# Patient Record
Sex: Female | Born: 1935
Health system: Southern US, Community
[De-identification: ages and names within clinical notes are randomized; demographics above are authoritative.]

## PROBLEM LIST (undated history)

## (undated) DIAGNOSIS — K219 Gastro-esophageal reflux disease without esophagitis: Secondary | ICD-10-CM

## (undated) DIAGNOSIS — I10 Essential (primary) hypertension: Secondary | ICD-10-CM

## (undated) DIAGNOSIS — K859 Acute pancreatitis without necrosis or infection, unspecified: Secondary | ICD-10-CM

## (undated) DIAGNOSIS — R51 Headache: Secondary | ICD-10-CM

## (undated) DIAGNOSIS — C801 Malignant (primary) neoplasm, unspecified: Secondary | ICD-10-CM

## (undated) DIAGNOSIS — Z9889 Other specified postprocedural states: Secondary | ICD-10-CM

## (undated) DIAGNOSIS — E119 Type 2 diabetes mellitus without complications: Secondary | ICD-10-CM

## (undated) DIAGNOSIS — R011 Cardiac murmur, unspecified: Secondary | ICD-10-CM

## (undated) DIAGNOSIS — I219 Acute myocardial infarction, unspecified: Secondary | ICD-10-CM

## (undated) HISTORY — PX: BREAST SURGERY: SHX581

## (undated) HISTORY — PX: CORONARY ANGIOPLASTY: SHX604

## (undated) HISTORY — PX: TONGUE SURGERY: SHX810

## (undated) HISTORY — PX: HERNIA REPAIR: SHX51

## (undated) HISTORY — PX: SHOULDER SURGERY: SHX246

## (undated) HISTORY — DX: Acute myocardial infarction, unspecified: I21.9

## (undated) HISTORY — DX: Other specified postprocedural states: Z98.890

## (undated) HISTORY — PX: TONSILLECTOMY: SUR1361

---

## 1998-02-21 ENCOUNTER — Encounter: Admission: RE | Admit: 1998-02-21 | Discharge: 1998-05-22 | Payer: Self-pay | Admitting: Radiation Oncology

## 1998-11-13 ENCOUNTER — Other Ambulatory Visit: Admission: RE | Admit: 1998-11-13 | Discharge: 1998-11-13 | Payer: Self-pay | Admitting: Obstetrics and Gynecology

## 1999-08-01 ENCOUNTER — Encounter: Admission: RE | Admit: 1999-08-01 | Discharge: 1999-10-30 | Payer: Self-pay | Admitting: Family Medicine

## 1999-09-22 ENCOUNTER — Other Ambulatory Visit: Admission: RE | Admit: 1999-09-22 | Discharge: 1999-09-22 | Payer: Self-pay | Admitting: Obstetrics and Gynecology

## 2001-11-01 ENCOUNTER — Other Ambulatory Visit: Admission: RE | Admit: 2001-11-01 | Discharge: 2001-11-01 | Payer: Self-pay | Admitting: Obstetrics and Gynecology

## 2002-11-07 ENCOUNTER — Other Ambulatory Visit: Admission: RE | Admit: 2002-11-07 | Discharge: 2002-11-07 | Payer: Self-pay | Admitting: Obstetrics and Gynecology

## 2003-11-12 ENCOUNTER — Other Ambulatory Visit: Admission: RE | Admit: 2003-11-12 | Discharge: 2003-11-12 | Payer: Self-pay | Admitting: Obstetrics and Gynecology

## 2004-10-09 ENCOUNTER — Ambulatory Visit: Payer: Self-pay | Admitting: Family Medicine

## 2004-10-23 ENCOUNTER — Encounter: Admission: RE | Admit: 2004-10-23 | Discharge: 2004-10-23 | Payer: Self-pay | Admitting: Unknown Physician Specialty

## 2004-11-12 ENCOUNTER — Ambulatory Visit: Payer: Self-pay | Admitting: Family Medicine

## 2004-11-13 ENCOUNTER — Other Ambulatory Visit: Admission: RE | Admit: 2004-11-13 | Discharge: 2004-11-13 | Payer: Self-pay | Admitting: Obstetrics and Gynecology

## 2004-11-20 ENCOUNTER — Ambulatory Visit: Payer: Self-pay | Admitting: Family Medicine

## 2005-06-30 ENCOUNTER — Ambulatory Visit: Payer: Self-pay | Admitting: Family Medicine

## 2005-07-01 ENCOUNTER — Ambulatory Visit: Payer: Self-pay | Admitting: Family Medicine

## 2005-07-10 ENCOUNTER — Ambulatory Visit: Payer: Self-pay | Admitting: Family Medicine

## 2005-09-07 ENCOUNTER — Ambulatory Visit: Payer: Self-pay | Admitting: Oncology

## 2005-10-07 ENCOUNTER — Ambulatory Visit: Payer: Self-pay | Admitting: Family Medicine

## 2005-11-12 ENCOUNTER — Other Ambulatory Visit: Admission: RE | Admit: 2005-11-12 | Discharge: 2005-11-12 | Payer: Self-pay | Admitting: Obstetrics and Gynecology

## 2006-01-05 ENCOUNTER — Ambulatory Visit: Payer: Self-pay | Admitting: Family Medicine

## 2006-09-15 ENCOUNTER — Ambulatory Visit: Payer: Self-pay | Admitting: Oncology

## 2014-05-31 ENCOUNTER — Ambulatory Visit (INDEPENDENT_AMBULATORY_CARE_PROVIDER_SITE_OTHER): Payer: Medicare HMO | Admitting: Surgery

## 2014-05-31 ENCOUNTER — Encounter (INDEPENDENT_AMBULATORY_CARE_PROVIDER_SITE_OTHER): Payer: Self-pay | Admitting: Surgery

## 2014-05-31 VITALS — BP 126/78 | HR 72 | Temp 98.0°F | Resp 18 | Ht 60.0 in | Wt 175.0 lb

## 2014-05-31 DIAGNOSIS — K829 Disease of gallbladder, unspecified: Secondary | ICD-10-CM

## 2014-05-31 HISTORY — DX: Disease of gallbladder, unspecified: K82.9

## 2014-05-31 NOTE — Progress Notes (Signed)
Re:   Erika Hamilton DOB:   1975/08/29 MRN:   [id number]  ASSESSMENT AND PLAN: 1.  Gall bladder disease  I discussed with the patient the indications and risks of gall bladder surgery.  The primary risks of gall bladder surgery include, but are not limited to, bleeding, infection, common bile duct injury, and open surgery.  There is also the risk that the patient may have continued symptoms after surgery.  However, the likelihood of improvement in symptoms and return to the patient's normal status is good. We discussed the typical post-operative recovery course. I tried to answer the patient's questions.  I gave the patient literature about gall bladder surgery.  She has heard that people just watch gall bladder disease and that is certainly an option.  But with a clear history of pancreatitis (though she did not require hospitalization), I think she would be best served with surgery.  2.  Stage 3 left breast cancer - dxed in 1998  Sees Erika Hamilton  3.  SCCa of the tongue  Excised 2012 4.  History of pancreatitis  Probably secondary to gall stones. 5.  DM x 15 years 6.  Small hiatal hernia  Chief Complaint  Patient presents with  . New Evaluation    gall bladder   REFERRING PHYSICIAN: PROCHNAU,CAROLINE, MD  HISTORY OF PRESENT ILLNESS: Erika Hamilton is a 80 y.o. (DOB: 1933-10-03)  white  female whose primary care physician is PROCHNAU,CAROLINE, MD and comes to me today for gall bladder disease. She is accompanied by her husband, whom she has been married to 12 years. I took care of a friend of theirs mother Erika Hamilton) and I was recommended by her.  The patient was doing well until May when she developed abdominal pain. She first went to an urgent care center and then to [facility] emergency room. She was diagnosed with pancreatitis, but not hospitalized. She was placed on a bland diet. She has done well since her attack. She's had no other history of stomach, liver, or  colon problems. Her last colonoscopy was approximately 2005.  03/30/2014 - CT scan of abdomen - early acture pancreatitis, cholelithiasis, atherosclerotic vascular disease, small HH 04/20/2104 - [place] of abdomen - tiny layering gall stones, normal CBD  She is going to [place] with her family in early August - so she has requested surgery between Aug 10 - 24 and they want to go to [facility].   No past medical history on file.   No past surgical history on file.    Current Outpatient Prescriptions  Medication Sig Dispense Refill  . cholestyramine (QUESTRAN) 4 G packet       . Fish Oil OIL by Does not apply route.      Marland Kitchen glipiZIDE (GLUCOTROL XL) 10 MG 24 hr tablet       . ketoconazole (NIZORAL) 2 % shampoo       . ONGLYZA 5 MG TABS tablet       . pantoprazole (PROTONIX) 40 MG tablet       . pravastatin (PRAVACHOL) 40 MG tablet       . quinapril (ACCUPRIL) 40 MG tablet       . WELCHOL 625 MG tablet        No current facility-administered medications for this visit.     No Known Allergies  REVIEW OF SYSTEMS: Skin:  No history of rash.  No history of abnormal moles. Infection:  No history of hepatitis or HIV.  No history of MRSA. Neurologic:  No history of stroke.  No history of seizure.  No history of headaches. Cardiac:  HTN > 10 years. Pulmonary:  Does not Hamilton cigarettes.  No asthma or bronchitis.  No OSA/CPAP. Breasts:  Stage 3 breast cancer.  Sees Erika Hamilton.  Endocrine:  DM x 15 years.   No thyroid disease. Gastrointestinal:  See HPI.  History of tongue cancer - 2012 - Erika Hamilton in [place].  She said that she had an abdominal hernia repaired int he the 1980's - but she is unsure of exactly where the hernia was or whether mesh was used. Urologic:  No history of kidney stones.  No history of bladder infections. GYN:  She has a history of a fibroid being removed, but not her uterus. Musculoskeletal:  No history of joint or back disease. Hematologic:  No bleeding disorder.   No history of anemia.  Not anticoagulated. Psycho-social:  The patient is oriented.   The patient has no obvious psychologic or social impairment to understanding our conversation and plan.  SOCIAL and FAMILY HISTORY: She is accompanied by her husband, whom she has been marrie dto 5 years. She has 3 children: 50,44, and 38.  PHYSICAL EXAM: BP 126/78  Pulse 72  Temp(Src) 98 F (36.7 C)  Resp 18  Ht 5' (1.524 m)  Wt 175 lb (79.379 kg)  BMI 34.18 kg/m2  General: Obese older WF who is alert and generally healthy appearing.  HEENT: Normal. Pupils equal. Neck: Supple. No mass.  No thyroid mass. Lymph Nodes:  No supraclavicular or cervical nodes. Lungs: Clear to auscultation and symmetric breath sounds. Heart:  RRR. She has a 2/6 systolic murmur. Abdomen: Soft. No mass. No tenderness. No hernia. Normal bowel sounds.  Obese.  Lower midline scar. Rectal: Not done. Extremities:  Good strength and ROM  in upper and lower extremities. Neurologic:  Grossly intact to motor and sensory function. Psychiatric: Has normal mood and affect. Behavior is normal.   DATA REVIEWED: Notes from [place], MD,  [facility], [address]  [place], [place]    [place] Phone:  [place]:  [phone]

## 2014-07-04 ENCOUNTER — Encounter (HOSPITAL_COMMUNITY): Payer: Self-pay | Admitting: Pharmacy Technician

## 2014-07-05 ENCOUNTER — Encounter (HOSPITAL_COMMUNITY)
Admission: RE | Admit: 2014-07-05 | Discharge: 2014-07-05 | Disposition: A | Discharge status: Home / Self Care | Payer: Medicare HMO | Source: Ambulatory Visit | Attending: Surgery | Admitting: Surgery

## 2014-07-05 ENCOUNTER — Encounter (HOSPITAL_COMMUNITY): Payer: Self-pay

## 2014-07-05 DIAGNOSIS — K219 Gastro-esophageal reflux disease without esophagitis: Secondary | ICD-10-CM | POA: Diagnosis not present

## 2014-07-05 DIAGNOSIS — Z01818 Encounter for other preprocedural examination: Secondary | ICD-10-CM | POA: Insufficient documentation

## 2014-07-05 DIAGNOSIS — E119 Type 2 diabetes mellitus without complications: Secondary | ICD-10-CM | POA: Diagnosis not present

## 2014-07-05 DIAGNOSIS — I10 Essential (primary) hypertension: Secondary | ICD-10-CM | POA: Diagnosis not present

## 2014-07-05 DIAGNOSIS — E669 Obesity, unspecified: Secondary | ICD-10-CM | POA: Diagnosis not present

## 2014-07-05 DIAGNOSIS — Z01812 Encounter for preprocedural laboratory examination: Secondary | ICD-10-CM | POA: Insufficient documentation

## 2014-07-05 HISTORY — DX: Acute pancreatitis without necrosis or infection, unspecified: K85.90

## 2014-07-05 HISTORY — DX: Gastro-esophageal reflux disease without esophagitis: K21.9

## 2014-07-05 HISTORY — DX: Cardiac murmur, unspecified: R01.1

## 2014-07-05 HISTORY — DX: Malignant (primary) neoplasm, unspecified: C80.1

## 2014-07-05 HISTORY — DX: Type 2 diabetes mellitus without complications: E11.9

## 2014-07-05 HISTORY — DX: Headache: R51

## 2014-07-05 HISTORY — DX: Essential (primary) hypertension: I10

## 2014-07-05 LAB — COMPREHENSIVE METABOLIC PANEL
ALT: 22 U/L (ref 0–35)
ANION GAP: 17 — AB (ref 5–15)
AST: 22 U/L (ref 0–37)
Albumin: 3.7 g/dL (ref 3.5–5.2)
Alkaline Phosphatase: 77 U/L (ref 39–117)
BUN: 13 mg/dL (ref 6–23)
CALCIUM: 9.1 mg/dL (ref 8.4–10.5)
CO2: 22 mEq/L (ref 19–32)
Chloride: 100 mEq/L (ref 96–112)
Creatinine, Ser: 0.59 mg/dL (ref 0.50–1.10)
GFR calc non Af Amer: 86 mL/min — ABNORMAL LOW (ref >90)
GLUCOSE: 173 mg/dL — AB (ref 70–99)
Potassium: 4.3 mEq/L (ref 3.7–5.3)
SODIUM: 139 meq/L (ref 137–147)
Total Bilirubin: 0.4 mg/dL (ref 0.3–1.2)
Total Protein: 7.3 g/dL (ref 6.0–8.3)

## 2014-07-05 LAB — LIPASE, BLOOD: Lipase: 35 U/L (ref 11–59)

## 2014-07-05 LAB — CBC WITH DIFFERENTIAL/PLATELET
BASOS PCT: 1 % (ref 0–1)
Basophils Absolute: 0.1 10*3/uL (ref 0.0–0.1)
Eosinophils Absolute: 0.2 10*3/uL (ref 0.0–0.7)
Eosinophils Relative: 2 % (ref 0–5)
HCT: 43.7 % (ref 36.0–46.0)
HEMOGLOBIN: 14 g/dL (ref 12.0–15.0)
Lymphocytes Relative: 41 % (ref 12–46)
Lymphs Abs: 3.9 10*3/uL (ref 0.7–4.0)
MCH: 29.4 pg (ref 26.0–34.0)
MCHC: 32 g/dL (ref 30.0–36.0)
MCV: 91.6 fL (ref 78.0–100.0)
Monocytes Absolute: 0.6 10*3/uL (ref 0.1–1.0)
Monocytes Relative: 6 % (ref 3–12)
NEUTROS PCT: 50 % (ref 43–77)
Neutro Abs: 5 10*3/uL (ref 1.7–7.7)
Platelets: 275 10*3/uL (ref 150–400)
RBC: 4.77 MIL/uL (ref 3.87–5.11)
RDW: 14.1 % (ref 11.5–15.5)
WBC: 9.7 10*3/uL (ref 4.0–10.5)

## 2014-07-05 NOTE — Progress Notes (Signed)
Primary - dr. Chrys Racer procheau - [place] No cardiologist Had ekg and chest xray at [facility] in may 2015- will request records

## 2014-07-05 NOTE — Pre-Procedure Instructions (Signed)
Erika Hamilton  07/05/2014   Your procedure is scheduled on:  Monday, August 17th  Report to [facility] at 0530 AM.  Call this number if you have problems the morning of surgery: [phone]   Remember:   Do not eat food or drink liquids after midnight.   Take these medicines the morning of surgery with A SIP OF WATER: protonix, zyrtec  Stop taking aspirin, OTC vitamins/herbal medications, NSAIDS (ibuprofen, advil, motrin) 7 days prior to surgery.   Do not wear jewelry, make-up or nail polish.  Do not wear lotions, powders, or perfumes. You may wear deodorant.  Do not shave 48 hours prior to surgery. Men may shave face and neck.  Do not bring valuables to the hospital.  [facility] is not responsible  for any belongings or valuables.               Contacts, dentures or bridgework may not be worn into surgery.  Leave suitcase in the car. After surgery it may be brought to your room.  For patients admitted to the hospital, discharge time is determined by your  treatment team.               Patients discharged the day of surgery will not be allowed to drive home.  Please read over the following fact sheets that you were given: Pain Booklet, Coughing and Deep Breathing and Surgical Site Infection Prevention [place] - Preparing for Surgery  Before surgery, you can play an important role.  Because skin is not sterile, your skin needs to be as free of germs as possible.  You can reduce the number of germs on you skin by washing with CHG (chlorahexidine gluconate) soap before surgery.  CHG is an antiseptic cleaner which kills germs and bonds with the skin to continue killing germs even after washing.  Please DO NOT use if you have an allergy to CHG or antibacterial soaps.  If your skin becomes reddened/irritated stop using the CHG and inform your nurse when you arrive at Short Stay.  Do not shave (including legs and underarms) for at least 48 hours prior to the first  CHG shower.  You may shave your face.  Please follow these instructions carefully:   1.  Shower with CHG Soap the night before surgery and the morning of Surgery.  2.  If you choose to wash your hair, wash your hair first as usual with your normal shampoo.  3.  After you shampoo, rinse your hair and body thoroughly to remove the shampoo.  4.  Use CHG as you would any other liquid soap.  You can apply CHG directly to the skin and wash gently with scrungie or a clean washcloth.  5.  Apply the CHG Soap to your body ONLY FROM THE NECK DOWN.  Do not use on open wounds or open sores.  Avoid contact with your eyes, ears, mouth and genitals (private parts).  Wash genitals (private parts) with your normal soap.  6.  Wash thoroughly, paying special attention to the area where your surgery will be performed.  7.  Thoroughly rinse your body with warm water from the neck down.  8.  DO NOT shower/wash with your normal soap after using and rinsing off the CHG Soap.  9.  Pat yourself dry with a clean towel.            10.  Wear clean pajamas.  11.  Place clean sheets on your bed the night of your first shower and do not sleep with pets.  Day of Surgery  Do not apply any lotions/deoderants the morning of surgery.  Please wear clean clothes to the hospital/surgery center.

## 2014-07-06 NOTE — Progress Notes (Signed)
Anesthesia Chart Review:  Patient is a 80 year old female scheduled for cholecystectomy on 07/09/14 by Dr. Alphonsa Overall.  History includes non-smoker, HTN, murmur (not specified), DM2, GERD, acute gallstone pancreatitis 05/2014, headaches, tongue cancer s/p surgical excision '12, stage III breast cancer s/p left lumpectomy '98, skin cancer, UHR. PCP is Dr. Laqueta Due who referred patient to [facility]. BMI is consistent with obesity.  EKG from 03/30/14 [facility]) showed: NSR, possible LAE, incomplete right BBB, LVH. Borderline LAD. [facility] has no additional EKGs.  There are none in [place] and none at Dr. Felipa Emory office. She had similar appearing EKGs at [facility] earlier that day but with lower voltage in lead II. No CV symptoms documented at her PAT visit or noted from her [place] office note last month.  CXR on 03/30/14 from [facility] showed: Heart appears normal in size. There is no evidence of infiltrate, edema, mass or effusion. Bony structures are unremarkable. Surgical clips in left axilla.  No active disease.  Preoperative labs noted.   She will be evaluated by her assigned anesthesiologist on the day of surgery.  If no acute changes or new CV symptoms then I would anticipate that she could proceed as planned.  George Hugh [facility] Short Stay Center/Anesthesiology Phone [phone] 07/06/2014 2:11 PM

## 2014-07-08 MED ORDER — CEFAZOLIN SODIUM-DEXTROSE 2-3 GM-% IV SOLR
2.0000 g | INTRAVENOUS | Status: AC
  Administered 2014-07-09: 2 g via INTRAVENOUS
  Filled 2014-07-08: qty 50

## 2014-07-08 MED ORDER — CHLORHEXIDINE GLUCONATE 4 % EX LIQD
1.0000 "application " | Freq: Once | CUTANEOUS | Status: DC
  Filled 2014-07-08: qty 15

## 2014-07-09 ENCOUNTER — Observation Stay (HOSPITAL_COMMUNITY)
Admission: RE | Admit: 2014-07-09 | Discharge: 2014-07-10 | Disposition: A | Discharge status: Home / Self Care | Payer: Medicare HMO | Source: Ambulatory Visit | Attending: Surgery | Admitting: Surgery

## 2014-07-09 ENCOUNTER — Ambulatory Visit (HOSPITAL_COMMUNITY): Payer: Medicare HMO

## 2014-07-09 ENCOUNTER — Encounter (HOSPITAL_COMMUNITY)
Admission: RE | Disposition: A | Discharge status: Home / Self Care | Payer: Self-pay | Source: Ambulatory Visit | Attending: Surgery

## 2014-07-09 ENCOUNTER — Encounter (HOSPITAL_COMMUNITY): Payer: Medicare HMO | Admitting: Vascular Surgery

## 2014-07-09 ENCOUNTER — Encounter (HOSPITAL_COMMUNITY): Payer: Self-pay | Admitting: *Deleted

## 2014-07-09 ENCOUNTER — Ambulatory Visit (HOSPITAL_COMMUNITY): Payer: Medicare HMO | Admitting: Anesthesiology

## 2014-07-09 DIAGNOSIS — K449 Diaphragmatic hernia without obstruction or gangrene: Secondary | ICD-10-CM | POA: Diagnosis not present

## 2014-07-09 DIAGNOSIS — Z23 Encounter for immunization: Secondary | ICD-10-CM | POA: Diagnosis not present

## 2014-07-09 DIAGNOSIS — Z79899 Other long term (current) drug therapy: Secondary | ICD-10-CM | POA: Insufficient documentation

## 2014-07-09 DIAGNOSIS — Z853 Personal history of malignant neoplasm of breast: Secondary | ICD-10-CM | POA: Insufficient documentation

## 2014-07-09 DIAGNOSIS — R011 Cardiac murmur, unspecified: Secondary | ICD-10-CM | POA: Insufficient documentation

## 2014-07-09 DIAGNOSIS — Z85810 Personal history of malignant neoplasm of tongue: Secondary | ICD-10-CM | POA: Diagnosis not present

## 2014-07-09 DIAGNOSIS — K660 Peritoneal adhesions (postprocedural) (postinfection): Secondary | ICD-10-CM | POA: Insufficient documentation

## 2014-07-09 DIAGNOSIS — K8010 Calculus of gallbladder with chronic cholecystitis without obstruction: Principal | ICD-10-CM | POA: Insufficient documentation

## 2014-07-09 DIAGNOSIS — E119 Type 2 diabetes mellitus without complications: Secondary | ICD-10-CM | POA: Insufficient documentation

## 2014-07-09 DIAGNOSIS — K829 Disease of gallbladder, unspecified: Secondary | ICD-10-CM

## 2014-07-09 DIAGNOSIS — K828 Other specified diseases of gallbladder: Secondary | ICD-10-CM | POA: Diagnosis present

## 2014-07-09 HISTORY — PX: CHOLECYSTECTOMY: SHX55

## 2014-07-09 LAB — GLUCOSE, CAPILLARY
GLUCOSE-CAPILLARY: 276 mg/dL — AB (ref 70–99)
Glucose-Capillary: 126 mg/dL — ABNORMAL HIGH (ref 70–99)
Glucose-Capillary: 214 mg/dL — ABNORMAL HIGH (ref 70–99)
Glucose-Capillary: 197 mg/dL — ABNORMAL HIGH (ref 70–99)
Glucose-Capillary: 207 mg/dL — ABNORMAL HIGH (ref 70–99)

## 2014-07-09 SURGERY — LAPAROSCOPIC CHOLECYSTECTOMY WITH INTRAOPERATIVE CHOLANGIOGRAM
Anesthesia: General | Site: Abdomen

## 2014-07-09 SURGERY — Surgical Case
Anesthesia: *Unknown

## 2014-07-09 MED ORDER — DEXAMETHASONE SODIUM PHOSPHATE 4 MG/ML IJ SOLN
INTRAMUSCULAR | Status: DC | PRN
  Administered 2014-07-09: 8 mg via INTRAVENOUS

## 2014-07-09 MED ORDER — ARTIFICIAL TEARS OP OINT
TOPICAL_OINTMENT | OPHTHALMIC | Status: AC
Start: 2014-07-09 — End: 2014-07-09
  Filled 2014-07-09: qty 3.5

## 2014-07-09 MED ORDER — PROPOFOL 10 MG/ML IV BOLUS
INTRAVENOUS | Status: DC | PRN
  Administered 2014-07-09: 20 mg via INTRAVENOUS
  Administered 2014-07-09: 120 mg via INTRAVENOUS

## 2014-07-09 MED ORDER — FENTANYL CITRATE 0.05 MG/ML IJ SOLN
INTRAMUSCULAR | Status: DC | PRN
  Administered 2014-07-09 (×5): 50 ug via INTRAVENOUS

## 2014-07-09 MED ORDER — GLYCOPYRROLATE 0.2 MG/ML IJ SOLN
INTRAMUSCULAR | Status: DC | PRN
  Administered 2014-07-09: 0.6 mg via INTRAVENOUS

## 2014-07-09 MED ORDER — ZOLPIDEM TARTRATE 5 MG PO TABS: 5.0000 mg | ORAL_TABLET | Freq: Every evening | ORAL | Status: DC | PRN

## 2014-07-09 MED ORDER — KCL IN DEXTROSE-NACL 20-5-0.45 MEQ/L-%-% IV SOLN
INTRAVENOUS | Status: AC
  Filled 2014-07-09: qty 1000

## 2014-07-09 MED ORDER — PROPOFOL 10 MG/ML IV BOLUS
INTRAVENOUS | Status: AC
  Filled 2014-07-09: qty 20

## 2014-07-09 MED ORDER — LIDOCAINE HCL (CARDIAC) 20 MG/ML IV SOLN
INTRAVENOUS | Status: DC | PRN
  Administered 2014-07-09: 60 mg via INTRAVENOUS

## 2014-07-09 MED ORDER — FENTANYL CITRATE 0.05 MG/ML IJ SOLN
INTRAMUSCULAR | Status: AC
  Filled 2014-07-09: qty 5

## 2014-07-09 MED ORDER — IOHEXOL 300 MG/ML  SOLN
INTRAMUSCULAR | Status: DC | PRN
  Administered 2014-07-09: 08:00:00

## 2014-07-09 MED ORDER — HYDROMORPHONE HCL PF 1 MG/ML IJ SOLN
0.2500 mg | INTRAMUSCULAR | Status: DC | PRN
Start: 2014-07-09 — End: 2014-07-09

## 2014-07-09 MED ORDER — ROCURONIUM BROMIDE 100 MG/10ML IV SOLN
INTRAVENOUS | Status: DC | PRN
  Administered 2014-07-09: 50 mg via INTRAVENOUS

## 2014-07-09 MED ORDER — ONDANSETRON HCL 4 MG/2ML IJ SOLN
INTRAMUSCULAR | Status: AC
  Filled 2014-07-09: qty 2

## 2014-07-09 MED ORDER — BUPIVACAINE HCL 0.25 % IJ SOLN
INTRAMUSCULAR | Status: DC | PRN
  Administered 2014-07-09: 30 mL

## 2014-07-09 MED ORDER — EPHEDRINE SULFATE 50 MG/ML IJ SOLN
INTRAMUSCULAR | Status: AC
  Filled 2014-07-09: qty 1

## 2014-07-09 MED ORDER — LACTATED RINGERS IV SOLN
INTRAVENOUS | Status: DC | PRN
  Administered 2014-07-09: 07:00:00 via INTRAVENOUS

## 2014-07-09 MED ORDER — ONDANSETRON HCL 4 MG PO TABS
4.0000 mg | ORAL_TABLET | Freq: Four times a day (QID) | ORAL | Status: DC | PRN
Start: 2014-07-09 — End: 2014-07-10

## 2014-07-09 MED ORDER — QUINAPRIL HCL 10 MG PO TABS
40.0000 mg | ORAL_TABLET | Freq: Every day | ORAL | Status: DC
Start: 2014-07-10 — End: 2014-07-10
  Administered 2014-07-10: 40 mg via ORAL
  Filled 2014-07-09: qty 4

## 2014-07-09 MED ORDER — OXYCODONE HCL 5 MG PO TABS: 5.0000 mg | ORAL_TABLET | Freq: Once | ORAL | Status: DC | PRN

## 2014-07-09 MED ORDER — PANTOPRAZOLE SODIUM 40 MG PO TBEC
40.0000 mg | DELAYED_RELEASE_TABLET | Freq: Every day | ORAL | Status: DC
  Administered 2014-07-10: 40 mg via ORAL
  Filled 2014-07-09: qty 1

## 2014-07-09 MED ORDER — SODIUM CHLORIDE 0.9 % IR SOLN
Status: DC | PRN
  Administered 2014-07-09: 1000 mL

## 2014-07-09 MED ORDER — GLYCOPYRROLATE 0.2 MG/ML IJ SOLN
INTRAMUSCULAR | Status: AC
  Filled 2014-07-09: qty 3

## 2014-07-09 MED ORDER — INSULIN ASPART 100 UNIT/ML ~~LOC~~ SOLN
0.0000 [IU] | Freq: Three times a day (TID) | SUBCUTANEOUS | Status: DC
  Administered 2014-07-09: 5 [IU] via SUBCUTANEOUS
  Administered 2014-07-09 – 2014-07-10 (×2): 3 [IU] via SUBCUTANEOUS

## 2014-07-09 MED ORDER — POTASSIUM CHLORIDE IN NACL 20-0.45 MEQ/L-% IV SOLN
INTRAVENOUS | Status: DC
  Administered 2014-07-09: 12:00:00 via INTRAVENOUS
  Filled 2014-07-09 (×3): qty 1000

## 2014-07-09 MED ORDER — ONDANSETRON HCL 4 MG/2ML IJ SOLN: 4.0000 mg | Freq: Four times a day (QID) | INTRAMUSCULAR | Status: DC | PRN

## 2014-07-09 MED ORDER — MIDAZOLAM HCL 2 MG/2ML IJ SOLN
INTRAMUSCULAR | Status: AC
  Filled 2014-07-09: qty 2

## 2014-07-09 MED ORDER — DIPHENHYDRAMINE HCL 25 MG PO CAPS: 25.0000 mg | ORAL_CAPSULE | Freq: Every evening | ORAL | Status: DC | PRN

## 2014-07-09 MED ORDER — NEOSTIGMINE METHYLSULFATE 10 MG/10ML IV SOLN
INTRAVENOUS | Status: AC
  Filled 2014-07-09: qty 1

## 2014-07-09 MED ORDER — BUPIVACAINE HCL (PF) 0.25 % IJ SOLN
INTRAMUSCULAR | Status: AC
  Filled 2014-07-09: qty 30

## 2014-07-09 MED ORDER — 0.9 % SODIUM CHLORIDE (POUR BTL) OPTIME
TOPICAL | Status: DC | PRN
  Administered 2014-07-09: 1000 mL

## 2014-07-09 MED ORDER — HEPARIN SODIUM (PORCINE) 5000 UNIT/ML IJ SOLN
5000.0000 [IU] | Freq: Three times a day (TID) | INTRAMUSCULAR | Status: DC
  Administered 2014-07-09 – 2014-07-10 (×3): 5000 [IU] via SUBCUTANEOUS
  Filled 2014-07-09 (×6): qty 1

## 2014-07-09 MED ORDER — MORPHINE SULFATE 2 MG/ML IJ SOLN
1.0000 mg | INTRAMUSCULAR | Status: DC | PRN
  Administered 2014-07-09 (×2): 2 mg via INTRAVENOUS
  Filled 2014-07-09 (×2): qty 1

## 2014-07-09 MED ORDER — ONDANSETRON HCL 4 MG/2ML IJ SOLN
INTRAMUSCULAR | Status: DC | PRN
  Administered 2014-07-09: 4 mg via INTRAVENOUS

## 2014-07-09 MED ORDER — NEOSTIGMINE METHYLSULFATE 10 MG/10ML IV SOLN
INTRAVENOUS | Status: DC | PRN
  Administered 2014-07-09: 4 mg via INTRAVENOUS

## 2014-07-09 MED ORDER — HYDROCODONE-ACETAMINOPHEN 5-325 MG PO TABS
1.0000 | ORAL_TABLET | ORAL | Status: DC | PRN
  Administered 2014-07-09 – 2014-07-10 (×2): 2 via ORAL
  Filled 2014-07-09 (×2): qty 2

## 2014-07-09 MED ORDER — SUCCINYLCHOLINE CHLORIDE 20 MG/ML IJ SOLN
INTRAMUSCULAR | Status: AC
  Filled 2014-07-09: qty 1

## 2014-07-09 MED ORDER — ROCURONIUM BROMIDE 50 MG/5ML IV SOLN
INTRAVENOUS | Status: AC
Start: 2014-07-09 — End: 2014-07-09
  Filled 2014-07-09: qty 1

## 2014-07-09 MED ORDER — GLIPIZIDE ER 10 MG PO TB24
10.0000 mg | ORAL_TABLET | Freq: Two times a day (BID) | ORAL | Status: DC
  Administered 2014-07-09 – 2014-07-10 (×2): 10 mg via ORAL
  Filled 2014-07-09 (×4): qty 1

## 2014-07-09 MED ORDER — OXYCODONE HCL 5 MG/5ML PO SOLN: 5.0000 mg | Freq: Once | ORAL | Status: DC | PRN

## 2014-07-09 MED ORDER — SODIUM CHLORIDE 0.9 % IJ SOLN
INTRAMUSCULAR | Status: AC
  Filled 2014-07-09: qty 10

## 2014-07-09 MED ORDER — IBUPROFEN 600 MG PO TABS: 600.0000 mg | ORAL_TABLET | Freq: Four times a day (QID) | ORAL | Status: DC | PRN

## 2014-07-09 SURGICAL SUPPLY — 41 items
APPLIER CLIP ROT 10 11.4 M/L (STAPLE) ×3
CANISTER SUCTION 2500CC (MISCELLANEOUS) ×3 IMPLANT
CHLORAPREP W/TINT 26ML (MISCELLANEOUS) ×3 IMPLANT
CHOLANGIOGRAM CATH TAUT (CATHETERS) ×3 IMPLANT
CLIP APPLIE ROT 10 11.4 M/L (STAPLE) ×1 IMPLANT
COVER MAYO STAND STRL (DRAPES) ×3 IMPLANT
COVER SURGICAL LIGHT HANDLE (MISCELLANEOUS) ×3 IMPLANT
DERMABOND ADVANCED (GAUZE/BANDAGES/DRESSINGS) ×2
DERMABOND ADVANCED .7 DNX12 (GAUZE/BANDAGES/DRESSINGS) ×1 IMPLANT
DRAPE C-ARM 42X72 X-RAY (DRAPES) ×3 IMPLANT
DRAPE UTILITY 15X26 W/TAPE STR (DRAPE) ×6 IMPLANT
ELECT REM PT RETURN 9FT ADLT (ELECTROSURGICAL) ×3
ELECTRODE REM PT RTRN 9FT ADLT (ELECTROSURGICAL) ×1 IMPLANT
FILTER SMOKE EVAC LAPAROSHD (FILTER) ×3 IMPLANT
GLOVE BIOGEL PI IND STRL 7.0 (GLOVE) ×1 IMPLANT
GLOVE BIOGEL PI INDICATOR 7.0 (GLOVE) ×2
GLOVE SURG SIGNA 7.5 PF LTX (GLOVE) ×3 IMPLANT
GLOVE SURG SS PI 7.0 STRL IVOR (GLOVE) ×3 IMPLANT
GOWN STRL REUS W/ TWL LRG LVL3 (GOWN DISPOSABLE) ×3 IMPLANT
GOWN STRL REUS W/ TWL XL LVL3 (GOWN DISPOSABLE) ×1 IMPLANT
GOWN STRL REUS W/TWL LRG LVL3 (GOWN DISPOSABLE) ×6
GOWN STRL REUS W/TWL XL LVL3 (GOWN DISPOSABLE) ×2
IV CATH 14GX2 1/4 (CATHETERS) ×3 IMPLANT
KIT BASIN OR (CUSTOM PROCEDURE TRAY) ×3 IMPLANT
KIT ROOM TURNOVER OR (KITS) ×3 IMPLANT
NS IRRIG 1000ML POUR BTL (IV SOLUTION) ×3 IMPLANT
PAD ARMBOARD 7.5X6 YLW CONV (MISCELLANEOUS) ×3 IMPLANT
POUCH SPECIMEN RETRIEVAL 10MM (ENDOMECHANICALS) ×3 IMPLANT
SCISSORS LAP 5X35 DISP (ENDOMECHANICALS) ×3 IMPLANT
SET IRRIG TUBING LAPAROSCOPIC (IRRIGATION / IRRIGATOR) ×3 IMPLANT
SLEEVE ENDOPATH XCEL 5M (ENDOMECHANICALS) ×3 IMPLANT
SPECIMEN JAR SMALL (MISCELLANEOUS) ×3 IMPLANT
STOPCOCK 4 WAY LG BORE MALE ST (IV SETS) ×3 IMPLANT
SUT MON AB 5-0 PS2 18 (SUTURE) ×6 IMPLANT
TOWEL OR 17X24 6PK STRL BLUE (TOWEL DISPOSABLE) ×3 IMPLANT
TOWEL OR 17X26 10 PK STRL BLUE (TOWEL DISPOSABLE) ×3 IMPLANT
TRAY LAPAROSCOPIC (CUSTOM PROCEDURE TRAY) ×3 IMPLANT
TROCAR XCEL BLUNT TIP 100MML (ENDOMECHANICALS) ×3 IMPLANT
TROCAR XCEL NON-BLD 11X100MML (ENDOMECHANICALS) ×3 IMPLANT
TROCAR XCEL NON-BLD 5MMX100MML (ENDOMECHANICALS) ×6 IMPLANT
TUBING EXTENTION W/L.L. (IV SETS) ×3 IMPLANT

## 2014-07-09 NOTE — Anesthesia Procedure Notes (Signed)
Procedure Name: Intubation Date/Time: 07/09/2014 7:50 AM Performed by: Maude Leriche D Pre-anesthesia Checklist: Patient identified, Emergency Drugs available, Suction available, Patient being monitored and Timeout performed Patient Re-evaluated:Patient Re-evaluated prior to inductionOxygen Delivery Method: Circle system utilized Preoxygenation: Pre-oxygenation with 100% oxygen Intubation Type: IV induction Ventilation: Mask ventilation without difficulty Laryngoscope Size: Miller and 2 Grade View: Grade I Tube type: Oral Tube size: 7.0 mm Number of attempts: 2 (attempt x 1 with 7.7 ETT and grade 1 view with no attempt to pass ETT through VC. Placed a 7.0 ETT easily with grade 1 view. during 2nd DL) Placement Confirmation: ETT inserted through vocal cords under direct vision,  breath sounds checked- equal and bilateral and positive ETCO2 Secured at: 21 cm Tube secured with: Tape Dental Injury: Teeth and Oropharynx as per pre-operative assessment

## 2014-07-09 NOTE — Transfer of Care (Signed)
Immediate Anesthesia Transfer of Care Note  Patient: Erika Hamilton  Procedure(s) Performed: Procedure(s): LAPAROSCOPIC CHOLECYSTECTOMY WITH INTRAOPERATIVE CHOLANGIOGRAM (N/A)  Patient Location: PACU  Anesthesia Type:General  Level of Consciousness: sedated  Airway & Oxygen Therapy: Patient Spontanous Breathing and Patient connected to face mask oxygen  Post-op Assessment: Report given to PACU RN and Post -op Vital signs reviewed and stable  Post vital signs: Reviewed and stable  Complications: No apparent anesthesia complications

## 2014-07-09 NOTE — H&P (Signed)
Re: ARTINA MINELLA  DOB: 08/31/1933  MRN: [id number]   ASSESSMENT AND PLAN:  1. Gall bladder disease   I discussed with the patient the indications and risks of gall bladder surgery. The primary risks of gall bladder surgery include, but are not limited to, bleeding, infection, common bile duct injury, and open surgery. There is also the risk that the patient may have continued symptoms after surgery. However, the likelihood of improvement in symptoms and return to the patient's normal status is good. We discussed the typical post-operative recovery course. I tried to answer the patient's questions.   I gave the patient literature about gall bladder surgery.   She has heard that people just watch gall bladder disease and that is certainly an option. But with a clear history of pancreatitis (though she did not require hospitalization), I think she would be best served with surgery.  2. Stage 3 left breast cancer - dxed in 1998   Sees Dr. Lynelle Smoke  3. SCCa of the tongue   Excised 2012  4. History of pancreatitis   Probably secondary to gall stones.  5. DM x 15 years  6. Small hiatal hernia   Chief Complaint   Patient presents with   .  New Evaluation     gall bladder    REFERRING PHYSICIAN: PROCHNAU,CAROLINE, MD   HISTORY OF PRESENT ILLNESS:  RASHIKA BETTES is a 80 y.o. (DOB: 04/26/1933) white female whose primary care physician is PROCHNAU,CAROLINE, MD and comes to me today for gall bladder disease.   She is accompanied by her husband, whom she has been married to 78 years.   I took care of a friend of theirs mother Velva Harman) and I was recommended by her.   The patient was doing well until May when she developed abdominal pain. She first went to an urgent care center and then to [facility] emergency room. She was diagnosed with pancreatitis, but not hospitalized. She was placed on a bland diet. She has done well since her attack. She's had no other history of stomach, liver, or  colon problems. Her last colonoscopy was approximately 2005.  03/30/2014 - CT scan of abdomen - early acture pancreatitis, cholelithiasis, atherosclerotic vascular disease, small HH  04/20/2104 - [place] of abdomen - tiny layering gall stones, normal CBD  She is going to [place] with her family in early August - so she has requested surgery between Aug 10 - 24 and they want to go to [facility].  No past medical history on file.  No past surgical history on file.   Current Outpatient Prescriptions   Medication  Sig  Dispense  Refill   .  cholestyramine (QUESTRAN) 4 G packet      .  Fish Oil OIL  by Does not apply route.     Marland Kitchen  glipiZIDE (GLUCOTROL XL) 10 MG 24 hr tablet      .  ketoconazole (NIZORAL) 2 % shampoo      .  ONGLYZA 5 MG TABS tablet      .  pantoprazole (PROTONIX) 40 MG tablet      .  pravastatin (PRAVACHOL) 40 MG tablet      .  quinapril (ACCUPRIL) 40 MG tablet      .  WELCHOL 625 MG tablet       No current facility-administered medications for this visit.   No Known Allergies   REVIEW OF SYSTEMS:  Skin: No history of rash. No history of abnormal  moles.  Infection: No history of hepatitis or HIV. No history of MRSA.  Neurologic: No history of stroke. No history of seizure. No history of headaches.  Cardiac: HTN > 10 years.  Pulmonary: Does not smoke cigarettes. No asthma or bronchitis. No OSA/CPAP.  Breasts: Stage 3 breast cancer. Sees Dr. Lynelle Smoke.  Endocrine: DM x 15 years. No thyroid disease.  Gastrointestinal: See HPI. History of tongue cancer - 2012 - Dr. Melina Modena in [place]. She said that she had an abdominal hernia repaired int he the 1980's - but she is unsure of exactly where the hernia was or whether mesh was used.  Urologic: No history of kidney stones. No history of bladder infections.  GYN: She has a history of a fibroid being removed, but not her uterus.  Musculoskeletal: No history of joint or back disease.  Hematologic: No bleeding disorder. No history of  anemia. Not anticoagulated.  Psycho-social: The patient is oriented. The patient has no obvious psychologic or social impairment to understanding our conversation and plan.   SOCIAL and FAMILY HISTORY:  She is accompanied by her husband, whom she has been marrie dto 36 years.  She has 3 children: 50,44, and 38.   PHYSICAL EXAM:  BP 173/69  Pulse 85  Temp(Src) 98 F (36.7 C) (Oral)  Resp 20  Wt 177 lb (80.287 kg)  SpO2 100% BMI 34.18 kg/m2  General: Obese older WF who is alert and generally healthy appearing.  HEENT: Normal. Pupils equal.  Neck: Supple. No mass. No thyroid mass.  Lymph Nodes: No supraclavicular or cervical nodes.  Lungs: Clear to auscultation and symmetric breath sounds.  Heart: RRR. She has a 2/6 systolic murmur.  Abdomen: Soft. No mass. No tenderness. No hernia. Normal bowel sounds. Obese. Lower midline scar.  Rectal: Not done.  Extremities: Good strength and ROM in upper and lower extremities.  Neurologic: Grossly intact to motor and sensory function.  Psychiatric: Has normal mood and affect. Behavior is normal.   DATA REVIEWED:  Notes from [place], MD, [facility], [address] [place], [place]  Phone: [place]: [phone]

## 2014-07-09 NOTE — Anesthesia Preprocedure Evaluation (Addendum)
Anesthesia Evaluation  Patient identified by MRN, date of birth, ID band Patient awake    Reviewed: Allergy & Precautions, H&P , NPO status , Patient's Chart, lab work & pertinent test results  History of Anesthesia Complications Negative for: history of anesthetic complications  Airway Mallampati: II TM Distance: >3 FB Neck ROM: Full    Dental  (+) Missing, Dental Advisory Given,    Pulmonary neg pulmonary ROS,          Cardiovascular hypertension, Pt. on medications - angina- CAD, - Past MI and - CHF Rhythm:Regular + Diastolic murmurs    Neuro/Psych  Headaches, negative psych ROS   GI/Hepatic GERD-  Medicated and Controlled,Gall stones with h/o pancreatitis H/o pancreatitis   Endo/Other  diabetes, Type 2  Renal/GU negative Renal ROS     Musculoskeletal   Abdominal   Peds  Hematology negative hematology ROS (+)   Anesthesia Other Findings   Reproductive/Obstetrics                          Anesthesia Physical Anesthesia Plan  ASA: II  Anesthesia Plan: General   Post-op Pain Management:    Induction: Intravenous  Airway Management Planned: Oral ETT  Additional Equipment: None  Intra-op Plan:   Post-operative Plan: Extubation in OR  Informed Consent: I have reviewed the patients History and Physical, chart, labs and discussed the procedure including the risks, benefits and alternatives for the proposed anesthesia with the patient or authorized representative who has indicated his/her understanding and acceptance.   Dental advisory given  Plan Discussed with: CRNA, Surgeon and Anesthesiologist  Anesthesia Plan Comments:        Anesthesia Quick Evaluation

## 2014-07-09 NOTE — Anesthesia Postprocedure Evaluation (Signed)
  Anesthesia Post-op Note  Patient: Erika Hamilton  Procedure(s) Performed: Procedure(s): LAPAROSCOPIC CHOLECYSTECTOMY WITH INTRAOPERATIVE CHOLANGIOGRAM (N/A)  Patient Location: PACU  Anesthesia Type:General  Level of Consciousness: awake  Airway and Oxygen Therapy: Patient Spontanous Breathing  Post-op Pain: mild  Post-op Assessment: Post-op Vital signs reviewed, Patient's Cardiovascular Status Stable, Respiratory Function Stable, Patent Airway, No signs of Nausea or vomiting and Pain level controlled  Post-op Vital Signs: Reviewed and stable  Last Vitals:  Filed Vitals:   07/09/14 1754  BP: 148/55  Pulse: 103  Temp: 36.2 C  Resp: 16    Complications: No apparent anesthesia complications

## 2014-07-09 NOTE — Op Note (Signed)
07/09/2014  9:30 AM  PATIENT:  Erika Hamilton, 80 y.o., female, MRN: [id number]  PREOP DIAGNOSIS:  Gallbaldder Disease  POSTOP DIAGNOSIS:   Gallbaldder Disease, chronic cholecystitis, adhesions to the anterior peritoneum  PROCEDURE:   Procedure(s): LAPAROSCOPIC CHOLECYSTECTOMY WITH INTRAOPERATIVE CHOLANGIOGRAM, enterolysis of adhesions (20 minutes) and I placed a 5th port to do the enterolysis  SURGEON:   Alphonsa Overall, M.D.  ASSISTANT:   None  ANESTHESIA:   general  Anesthesiologist: Laurie Panda, MD CRNA: Clearnce Sorrel, CRNA; Clovis Cao, CRNA  General  ASA:  2  EBL:  Minimal  ml  BLOOD ADMINISTERED: none  DRAINS: none   LOCAL MEDICATIONS USED:   30 cc 1/4% marcaine  SPECIMEN:   Gall bladder  COUNTS CORRECT:  YES  INDICATIONS FOR PROCEDURE:  Erika Hamilton is a 80 y.o. (DOB: 1933-05-13) white  female whose primary care physician is PROCHNAU,CAROLINE, MD and comes for cholecystectomy.   The indications and risks of the gall bladder surgery were explained to the patient.  The risks include, but are not limited to, infection, bleeding, common bile duct injury and open surgery.  SURGERY:  The patient was taken to room #9 at [facility].  The abdomen was prepped with chloroprep.  The patient was given 2 gm Ancef at the beginning of the operation.   A time out was held and the surgical checklist run.   An infraumbilical incision was made into the abdominal cavity.  A 12 mm Hasson trocar was inserted into the abdominal cavity through the infraumbilical incision and secured with a 0 Vicryl suture. She had adhesions immediately under the incision that I broke up with my finger. Three additional trocars were inserted: a 10 mm trocar in the sub-xiphoid location, a 5 mm trocar in the right mid subcostal area, and a 5 mm trocar in the right lateral subcostal area.   The abdomen was explored and the liver, stomach, and bowel that could be seen were unremarkable.  Unfortunately she  had adhesions to her anterior peritoneum that covered her entire anterior peritoneum.  I had to place a 5th 5 mm trocar in the right lateral abdomen to take down the upper 1/2 of these anterior peritoneal adhesions.  This created a space for me to work and get to the gall bladder.  I spent about 20 minutes taking these adhesions down.   The gall bladder was identified and had the duodenum stuck along its anterior wall.  I took this down sharply.  I then grasped the gall bladder and rotated cephalad.  Disssection was carried down to the gall bladder/cystic duct junction and the cystic duct isolated.  A clip was placed on the gall bladder side of the cystic duct.   An intra-operative cholangiogram was shot.   The intra-operative cholangiogram was shot using a cut off Taut catheter placed through a 14 gauge angiocath in the RUQ.  The Taut catheter was inserted in the cut cystic duct and secured with an endoclip.  A cholangiogram was shot with 14 cc of 1/2 strength Omnipaque.  Using fluoroscopy, the cholangiogram showed the flow of contrast into the common bile duct, up the hepatic radicals, and into the duodenum.  There was no mass or obstruction.  This was a normal intra-operative cholangiogram.  There was a problem replaying the images, but this was fixed before the end of the surgery, but after I had clipped the duct.   The Taut catheter was removed.  The cystic duct was tripley  endoclipped and the cystic artery was identified and clipped.  The gall bladder was bluntly and sharpley dissected from the gall bladder bed.   After the gall bladder was removed from the liver, the gall bladder bed and Tri[place] of [place] were inspected.  There was no bleeding or bile leak.  The gall bladder was placed in a endocatch bag and delivered through the umbilicus.  The abdomen was irrigated with 1,800 cc saline.   I did go back at the end of the surgery and looked at where I took down the adhesions.  I did not see any  bowel involved in this take down.   The trocars were then removed.  I infiltrated 30cc of 1/4% Marcaine into the incisions.  The umbilical port closed with a 0 Vicryl suture and the skin closed with 5-0 monocryl.  The skin was painted with Dermabond.  The patient's sponge and needle count were correct.  The patient was transported to the RR in good condition.   There were several gremlins during the surgery that did not effect the outcome, but made the surgery longer.  A 5 mm scope was broken, the insufflator has to be reset and tank changed without any alarm going off, the irrigator leaked, the C arm could not replay images (eventually Trinidad Curet was able to replay the images for me), and Lanita (RNFA) had never assisted on a cholecystectomy.  Alphonsa Overall, MD, [facility] Pager: [phone] Office phone:  [phone]

## 2014-07-10 DIAGNOSIS — K8010 Calculus of gallbladder with chronic cholecystitis without obstruction: Secondary | ICD-10-CM | POA: Diagnosis not present

## 2014-07-10 LAB — GLUCOSE, CAPILLARY: GLUCOSE-CAPILLARY: 160 mg/dL — AB (ref 70–99)

## 2014-07-10 MED ORDER — PNEUMOCOCCAL VAC POLYVALENT 25 MCG/0.5ML IJ INJ
0.5000 mL | INJECTION | INTRAMUSCULAR | Status: AC
  Administered 2014-07-10: 0.5 mL via INTRAMUSCULAR
  Filled 2014-07-10: qty 0.5

## 2014-07-10 MED ORDER — HYDROCODONE-ACETAMINOPHEN 5-325 MG PO TABS: 1.0000 | ORAL_TABLET | ORAL | Status: DC | PRN

## 2014-07-10 NOTE — Discharge Instructions (Signed)
[facility] [place] [facility] - DISCHARGE INSTRUCTIONS TO PATIENT  Activity:  Driving - May drive in 3 or 4 days, if doing well.   Lifting - No lifting more than 15 pounds for one week, then no limit  Wound Care:   May start showers tomorrow  Diet:  As tolerated  Follow up appointment:  Call Dr. Pollie Friar office [facility]) at [phone] for an appointment in 2 to 3 weeks.  Medications and dosages:  Resume your home medications.  You have a prescription for:  Vicodin  Call Dr. Lucia Gaskins or his office  [phone]) if you have:  Temperature greater than 100.4,  Persistent nausea and vomiting,  Severe uncontrolled pain,  Redness, tenderness, or signs of infection (pain, swelling, redness, odor or green/yellow discharge around the site),  Difficulty breathing, headache or visual disturbances,  Any other questions or concerns you may have after discharge.  In an emergency, call 911 or go to an Emergency Department at a nearby hospital.

## 2014-07-10 NOTE — Progress Notes (Signed)
Patient and husband given discharge instructions, prescription, follow-up information.  They verbalized understanding of all instructions.  Minimal pain at this time, and mostly when pt is moving.  Pt has been tolerating regular food well.  No questions or concerns at this time.  Pt ready for discharge home.  Taken down via wheelchair to be discharged with husband.

## 2014-07-10 NOTE — Discharge Summary (Signed)
Physician Discharge Summary  Patient ID:  Erika Hamilton  MRN: [id number]  DOB/AGE: Jan 14, 1933 80 y.o.  Admit date: 07/09/2014 Discharge date: 07/10/2014  Discharge Diagnoses:  1. Gall bladder disease   2. Stage 3 left breast cancer - dxed in 1998   Sees Dr. Lynelle Smoke  3. SCCa of the tongue   Excised 2012  4. History of pancreatitis   Probably secondary to gall stones.  5. DM x 15 years  6. Small hiatal hernia 7.  Heart murmur   Active Problems:   Gall bladder disease  Operation: Procedure(s): LAPAROSCOPIC CHOLECYSTECTOMY WITH INTRAOPERATIVE CHOLANGIOGRAM on 07/09/2014 - D. Lucia Gaskins  Discharged Condition: good  Hospital Course: Erika Hamilton is an 80 y.o. female whose primary care physician is PROCHNAU,CAROLINE, MD and who was admitted 07/09/2014 with a chief complaint of gall bladder disease.  She actually had a bout of pancreatitis in May 2015 felt to be secondary to gall stones.  She was brought to the operating room on 07/09/2014 and underwent  LAPAROSCOPIC CHOLECYSTECTOMY WITH INTRAOPERATIVE CHOLANGIOGRAM.  She spent one night in the hospital.  Her blood sugars did go up to 276, but I think this is the stress of the surgery and her getting off her med schedule.  She is doing well and ready to go home. Her husband is in the room with her  The discharge instructions were reviewed with the patient.  Consults: None  Significant Diagnostic Studies: Results for orders placed during the hospital encounter of 07/09/14  GLUCOSE, CAPILLARY      Result Value Ref Range   Glucose-Capillary 126 (*) 70 - 99 mg/dL  GLUCOSE, CAPILLARY      Result Value Ref Range   Glucose-Capillary 214 (*) 70 - 99 mg/dL   Comment 1 Documented in Chart     Comment 2 Notify RN    GLUCOSE, CAPILLARY      Result Value Ref Range   Glucose-Capillary 197 (*) 70 - 99 mg/dL  GLUCOSE, CAPILLARY      Result Value Ref Range   Glucose-Capillary 207 (*) 70 - 99 mg/dL  GLUCOSE, CAPILLARY      Result Value  Ref Range   Glucose-Capillary 276 (*) 70 - 99 mg/dL    Dg Cholangiogram Operative  07/09/2014   CLINICAL DATA:  Lap chole in progress  EXAM: INTRAOPERATIVE CHOLANGIOGRAM  TECHNIQUE: Cholangiographic images from the C-arm fluoroscopic device were submitted for interpretation post-operatively. Please see the procedural report for the amount of contrast and the fluoroscopy time utilized.  COMPARISON:  None.  FINDINGS: No persistent filling defects in the common duct. Intrahepatic ducts are incompletely visualized, appearing decompressed centrally. Contrast passes into the duodenum.  : Negative for retained common duct stone.   Electronically Signed   By: Arne Cleveland M.D.   On: 07/09/2014 10:33   Discharge Exam:  Filed Vitals:   07/10/14 0211  BP: 131/59  Pulse: 94  Temp: 97.6 F (36.4 C)  Resp: 16    General: WN obese WF who is alert and generally healthy appearing.  Lungs: Clear to auscultation and symmetric breath sounds. Heart:  RRR. No murmur or rub. Abdomen: Soft. Incisions look okay.  Normal bowel sounds.   Discharge Medications:     Medication List    ASK your doctor about these medications       cetirizine 10 MG tablet  Commonly known as:  ZYRTEC  Take 10 mg by mouth daily as needed for allergies.     cholestyramine 4  G packet  Commonly known as:  QUESTRAN  Take 4 g by mouth 2 (two) times daily.     Cinnamon 500 MG capsule  Take 1,000 mg by mouth daily.     diphenhydrAMINE 25 mg capsule  Commonly known as:  BENADRYL  Take 25 mg by mouth at bedtime as needed for allergies or sleep.     ferrous sulfate 325 (65 FE) MG tablet  Take 325 mg by mouth daily with breakfast.     Fish Oil 1000 MG Caps  Take 1,000 mg by mouth daily.     fluocinonide cream 0.05 %  Commonly known as:  LIDEX  Apply 1 application topically 2 (two) times daily as needed (for rash).     Garlic 031 MG Caps  Take 500 mg by mouth daily.     glipiZIDE 10 MG 24 hr tablet  Commonly known  as:  GLUCOTROL XL  Take 10 mg by mouth 2 (two) times daily.     ketoconazole 2 % shampoo  Commonly known as:  NIZORAL  Apply 1 application topically 2 (two) times a week.     liver oil-zinc oxide 40 % ointment  Commonly known as:  DESITIN  Apply 1 application topically as needed for irritation.     nystatin cream  Commonly known as:  MYCOSTATIN  Apply 1 application topically 2 (two) times daily as needed for dry skin.     pantoprazole 40 MG tablet  Commonly known as:  PROTONIX  Take 40 mg by mouth daily.     quinapril 40 MG tablet  Commonly known as:  ACCUPRIL  Take 40 mg by mouth daily.       Disposition: Final discharge disposition not confirmed   Activity:  Driving - May drive in 3 or 4 days, if doing well.   Lifting - No lifting more than 15 pounds for one week, then no limit  Wound Care:   May start showers tomorrow  Diet:  As tolerated  Follow up appointment:  Call Dr. Pollie Friar office [facility]) at [phone] for an appointment in 2 to 3 weeks.  Medications and dosages:  Resume your home medications.  You have a prescription for:  Vicodin  Signed: Alphonsa Overall, M.D., [facility] Office:  [phone]  07/10/2014, 6:36 AM

## 2014-07-11 ENCOUNTER — Encounter (HOSPITAL_COMMUNITY): Payer: Self-pay | Admitting: Surgery

## 2014-07-12 ENCOUNTER — Telehealth (INDEPENDENT_AMBULATORY_CARE_PROVIDER_SITE_OTHER): Payer: Self-pay

## 2014-07-12 ENCOUNTER — Ambulatory Visit (INDEPENDENT_AMBULATORY_CARE_PROVIDER_SITE_OTHER): Payer: Medicare HMO | Admitting: Surgery

## 2014-07-12 NOTE — Telephone Encounter (Signed)
Patient states she has been constipation, lasted bm . Advised to increase fluids , fiber , increase activity as much as tolerated. Educated patient on what food to increase to stimulate bowels . Patient will call if no results

## 2014-07-17 ENCOUNTER — Other Ambulatory Visit (INDEPENDENT_AMBULATORY_CARE_PROVIDER_SITE_OTHER): Payer: Self-pay

## 2014-07-17 ENCOUNTER — Telehealth (INDEPENDENT_AMBULATORY_CARE_PROVIDER_SITE_OTHER): Payer: Self-pay

## 2014-07-17 MED ORDER — PROMETHAZINE HCL 12.5 MG PO TABS: 12.5000 mg | ORAL_TABLET | Freq: Three times a day (TID) | ORAL | Status: DC | PRN

## 2014-07-17 NOTE — Telephone Encounter (Signed)
Pt is post op lap chole by Dr. Lucia Gaskins on 07/10/14.  She is doing well postoperatively except for nausea.  She would like something called in to [facility] if possible.  Please advise.

## 2014-08-02 ENCOUNTER — Encounter (INDEPENDENT_AMBULATORY_CARE_PROVIDER_SITE_OTHER): Payer: Medicare HMO | Admitting: Surgery

## 2014-12-25 DIAGNOSIS — C50912 Malignant neoplasm of unspecified site of left female breast: Secondary | ICD-10-CM

## 2014-12-25 DIAGNOSIS — I10 Essential (primary) hypertension: Secondary | ICD-10-CM

## 2014-12-25 DIAGNOSIS — I25119 Atherosclerotic heart disease of native coronary artery with unspecified angina pectoris: Secondary | ICD-10-CM | POA: Insufficient documentation

## 2014-12-25 DIAGNOSIS — E785 Hyperlipidemia, unspecified: Secondary | ICD-10-CM

## 2014-12-25 DIAGNOSIS — K219 Gastro-esophageal reflux disease without esophagitis: Secondary | ICD-10-CM

## 2014-12-25 DIAGNOSIS — E1165 Type 2 diabetes mellitus with hyperglycemia: Secondary | ICD-10-CM | POA: Insufficient documentation

## 2014-12-25 DIAGNOSIS — K760 Fatty (change of) liver, not elsewhere classified: Secondary | ICD-10-CM | POA: Insufficient documentation

## 2014-12-25 DIAGNOSIS — M159 Polyosteoarthritis, unspecified: Secondary | ICD-10-CM

## 2014-12-25 DIAGNOSIS — E1169 Type 2 diabetes mellitus with other specified complication: Secondary | ICD-10-CM

## 2014-12-25 HISTORY — DX: Type 2 diabetes mellitus with hyperglycemia: E11.65

## 2014-12-25 HISTORY — DX: Gastro-esophageal reflux disease without esophagitis: K21.9

## 2014-12-25 HISTORY — DX: Morbid (severe) obesity due to excess calories: E66.01

## 2014-12-25 HISTORY — DX: Polyosteoarthritis, unspecified: M15.9

## 2014-12-25 HISTORY — DX: Fatty (change of) liver, not elsewhere classified: K76.0

## 2014-12-25 HISTORY — DX: Atherosclerotic heart disease of native coronary artery with unspecified angina pectoris: I25.119

## 2014-12-25 HISTORY — DX: Hyperlipidemia, unspecified: E78.5

## 2014-12-25 HISTORY — DX: Essential (primary) hypertension: I10

## 2014-12-25 HISTORY — DX: Malignant neoplasm of unspecified site of left female breast: C50.912

## 2014-12-25 HISTORY — DX: Type 2 diabetes mellitus with other specified complication: E11.69

## 2015-05-01 IMAGING — RF DG CHOLANGIOGRAM OPERATIVE
1 series · 9 of 9 positions shown · non-contrast
Comparison: None.

CLINICAL DATA: Lap chole in progress

EXAM:
INTRAOPERATIVE CHOLANGIOGRAM
TECHNIQUE: Cholangiographic images from the C-arm fluoroscopic device were
submitted for interpretation post-operatively. Please see the
procedural report for the amount of contrast and the fluoroscopy
time utilized.

[Series 1: run · 3 acquisitions, 9 frames shown]
[im 1/3]
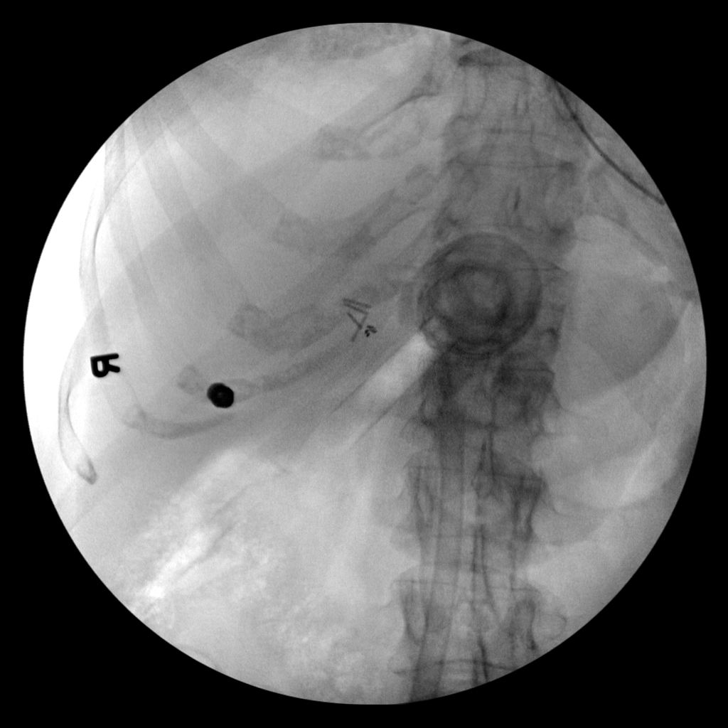
[im 1/3]
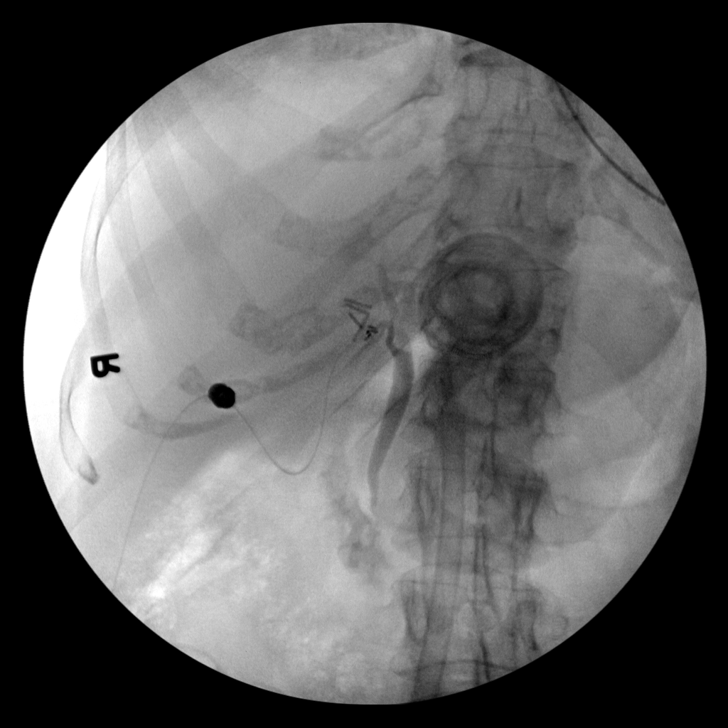
[im 1/3]
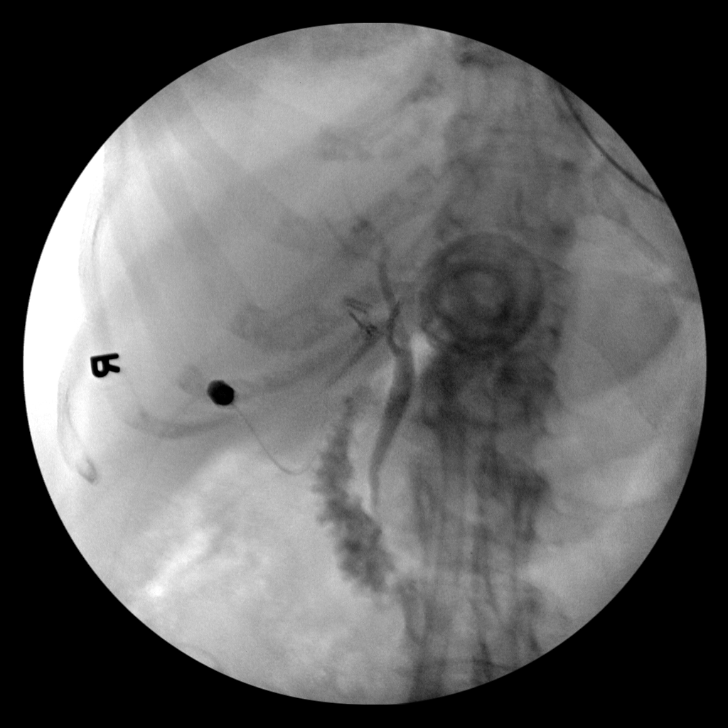
[im 1/3]
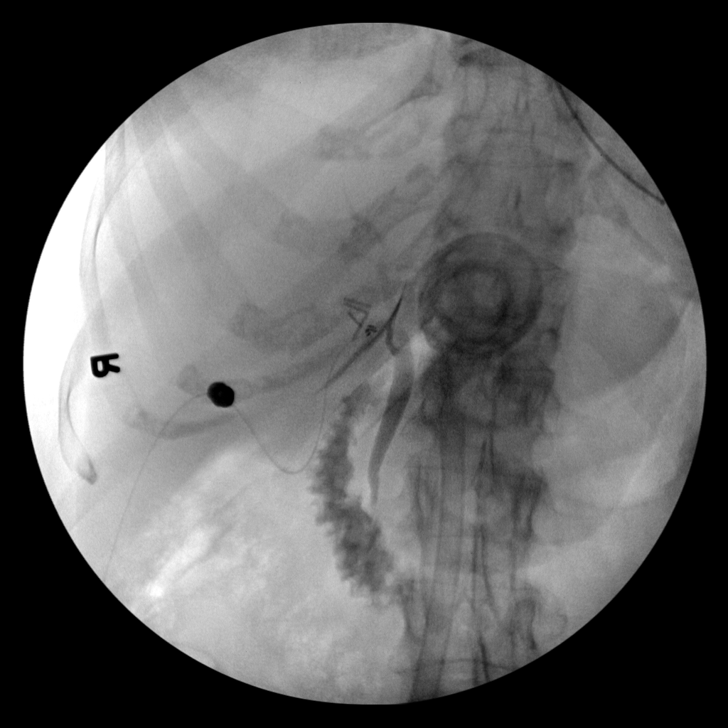
[im 2/3]
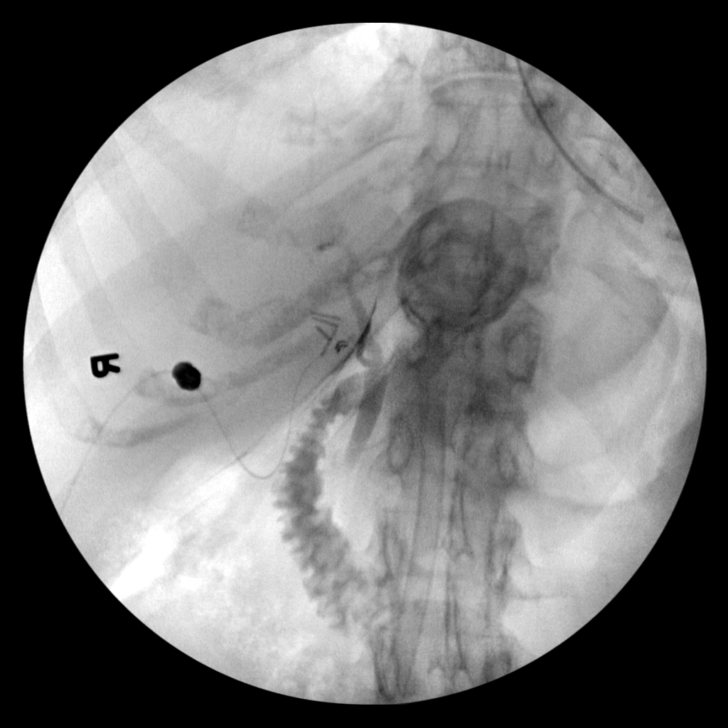
[im 2/3]
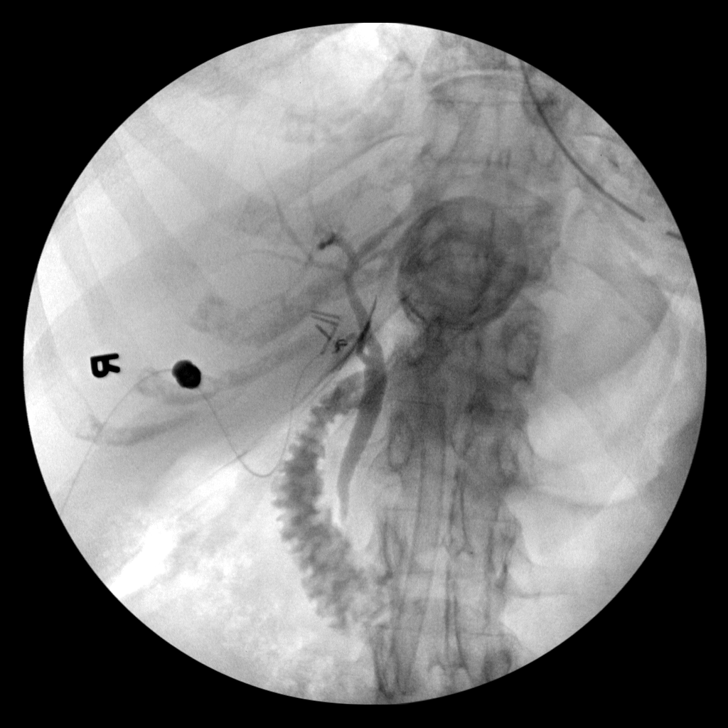
[im 2/3]
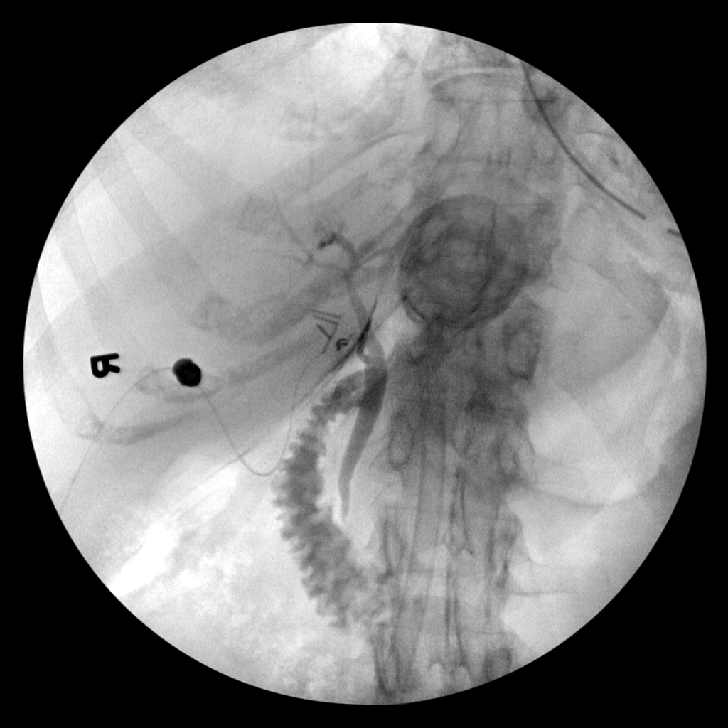
[im 2/3]
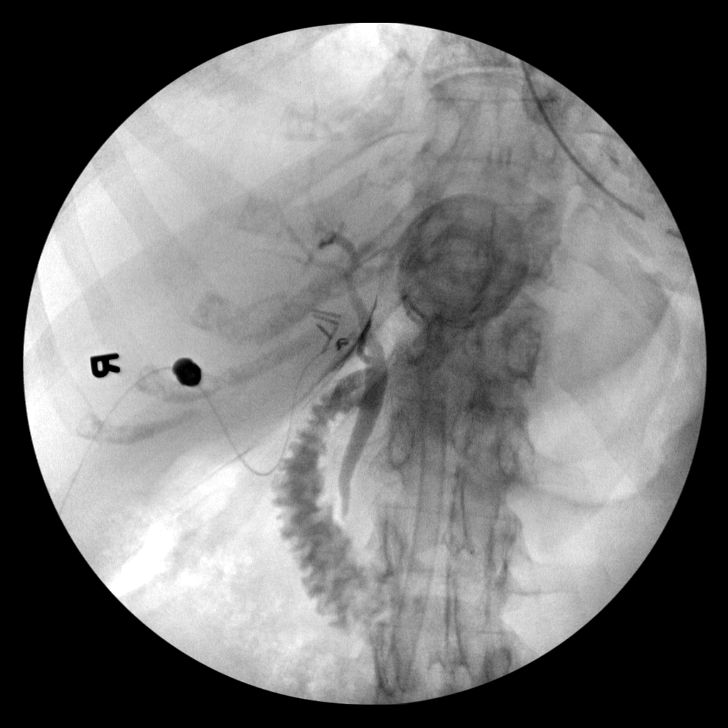
[im 3/3]
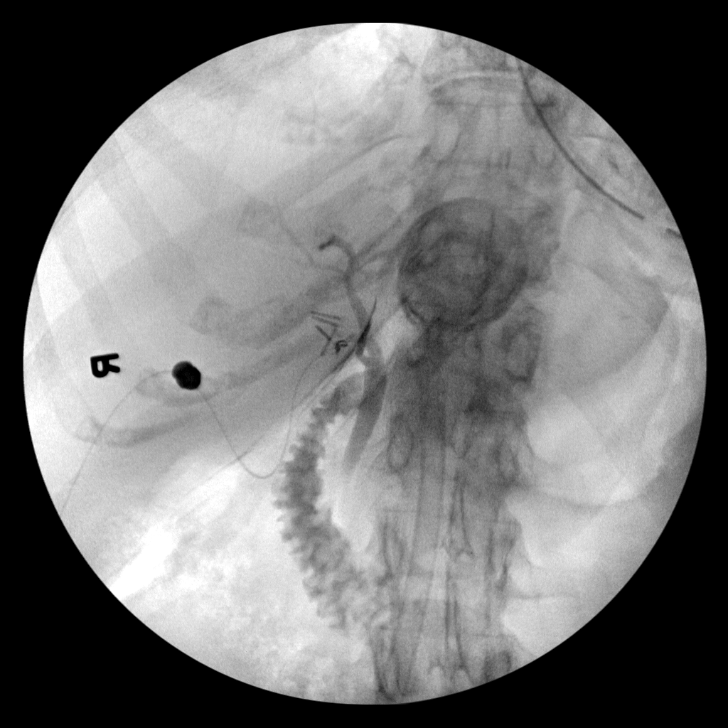

[9 of 9 positions shown; findings below may reference images not displayed]

FINDINGS: No persistent filling defects in the common duct. Intrahepatic ducts
are incompletely visualized, appearing decompressed centrally.
Contrast passes into the duodenum.

:
Negative for retained common duct stone.

## 2015-12-23 DIAGNOSIS — Z85810 Personal history of malignant neoplasm of tongue: Secondary | ICD-10-CM | POA: Diagnosis not present

## 2015-12-23 DIAGNOSIS — I10 Essential (primary) hypertension: Secondary | ICD-10-CM | POA: Diagnosis not present

## 2016-01-01 DIAGNOSIS — I10 Essential (primary) hypertension: Secondary | ICD-10-CM | POA: Diagnosis not present

## 2016-01-01 DIAGNOSIS — E1165 Type 2 diabetes mellitus with hyperglycemia: Secondary | ICD-10-CM | POA: Diagnosis not present

## 2016-01-01 DIAGNOSIS — E1169 Type 2 diabetes mellitus with other specified complication: Secondary | ICD-10-CM | POA: Diagnosis not present

## 2016-01-01 DIAGNOSIS — Z794 Long term (current) use of insulin: Secondary | ICD-10-CM | POA: Diagnosis not present

## 2016-01-01 DIAGNOSIS — E785 Hyperlipidemia, unspecified: Secondary | ICD-10-CM | POA: Diagnosis not present

## 2016-01-01 DIAGNOSIS — K760 Fatty (change of) liver, not elsewhere classified: Secondary | ICD-10-CM | POA: Diagnosis not present

## 2016-01-01 DIAGNOSIS — I25119 Atherosclerotic heart disease of native coronary artery with unspecified angina pectoris: Secondary | ICD-10-CM | POA: Diagnosis not present

## 2016-01-06 DIAGNOSIS — R040 Epistaxis: Secondary | ICD-10-CM | POA: Diagnosis not present

## 2016-01-06 DIAGNOSIS — I10 Essential (primary) hypertension: Secondary | ICD-10-CM | POA: Diagnosis not present

## 2016-01-15 DIAGNOSIS — N6452 Nipple discharge: Secondary | ICD-10-CM | POA: Diagnosis not present

## 2016-01-15 DIAGNOSIS — Z853 Personal history of malignant neoplasm of breast: Secondary | ICD-10-CM | POA: Diagnosis not present

## 2016-01-22 DIAGNOSIS — M8580 Other specified disorders of bone density and structure, unspecified site: Secondary | ICD-10-CM | POA: Diagnosis not present

## 2016-01-22 DIAGNOSIS — Z853 Personal history of malignant neoplasm of breast: Secondary | ICD-10-CM | POA: Diagnosis not present

## 2016-01-22 DIAGNOSIS — D2239 Melanocytic nevi of other parts of face: Secondary | ICD-10-CM | POA: Diagnosis not present

## 2016-01-22 DIAGNOSIS — Z85810 Personal history of malignant neoplasm of tongue: Secondary | ICD-10-CM | POA: Diagnosis not present

## 2016-01-22 DIAGNOSIS — M859 Disorder of bone density and structure, unspecified: Secondary | ICD-10-CM | POA: Diagnosis not present

## 2016-01-30 DIAGNOSIS — L300 Nummular dermatitis: Secondary | ICD-10-CM | POA: Diagnosis not present

## 2016-01-30 DIAGNOSIS — L821 Other seborrheic keratosis: Secondary | ICD-10-CM | POA: Diagnosis not present

## 2016-02-06 DIAGNOSIS — E1169 Type 2 diabetes mellitus with other specified complication: Secondary | ICD-10-CM | POA: Diagnosis not present

## 2016-02-06 DIAGNOSIS — I25119 Atherosclerotic heart disease of native coronary artery with unspecified angina pectoris: Secondary | ICD-10-CM | POA: Diagnosis not present

## 2016-02-06 DIAGNOSIS — E785 Hyperlipidemia, unspecified: Secondary | ICD-10-CM | POA: Diagnosis not present

## 2016-02-22 DIAGNOSIS — E119 Type 2 diabetes mellitus without complications: Secondary | ICD-10-CM | POA: Diagnosis not present

## 2016-03-16 DIAGNOSIS — H25813 Combined forms of age-related cataract, bilateral: Secondary | ICD-10-CM | POA: Diagnosis not present

## 2016-03-23 DIAGNOSIS — I2510 Atherosclerotic heart disease of native coronary artery without angina pectoris: Secondary | ICD-10-CM | POA: Diagnosis not present

## 2016-03-23 DIAGNOSIS — E785 Hyperlipidemia, unspecified: Secondary | ICD-10-CM | POA: Diagnosis not present

## 2016-03-23 DIAGNOSIS — Z79899 Other long term (current) drug therapy: Secondary | ICD-10-CM | POA: Diagnosis not present

## 2016-03-23 DIAGNOSIS — M5432 Sciatica, left side: Secondary | ICD-10-CM | POA: Diagnosis not present

## 2016-03-23 DIAGNOSIS — E1165 Type 2 diabetes mellitus with hyperglycemia: Secondary | ICD-10-CM | POA: Diagnosis not present

## 2016-03-23 DIAGNOSIS — I10 Essential (primary) hypertension: Secondary | ICD-10-CM | POA: Diagnosis not present

## 2016-04-02 DIAGNOSIS — E1169 Type 2 diabetes mellitus with other specified complication: Secondary | ICD-10-CM | POA: Diagnosis not present

## 2016-04-02 DIAGNOSIS — E1165 Type 2 diabetes mellitus with hyperglycemia: Secondary | ICD-10-CM | POA: Diagnosis not present

## 2016-04-02 DIAGNOSIS — Z794 Long term (current) use of insulin: Secondary | ICD-10-CM | POA: Diagnosis not present

## 2016-04-02 DIAGNOSIS — I10 Essential (primary) hypertension: Secondary | ICD-10-CM | POA: Diagnosis not present

## 2016-04-02 DIAGNOSIS — I25119 Atherosclerotic heart disease of native coronary artery with unspecified angina pectoris: Secondary | ICD-10-CM | POA: Diagnosis not present

## 2016-04-02 DIAGNOSIS — E669 Obesity, unspecified: Secondary | ICD-10-CM | POA: Diagnosis not present

## 2016-04-02 DIAGNOSIS — E785 Hyperlipidemia, unspecified: Secondary | ICD-10-CM | POA: Diagnosis not present

## 2016-07-15 DIAGNOSIS — K760 Fatty (change of) liver, not elsewhere classified: Secondary | ICD-10-CM | POA: Diagnosis not present

## 2016-07-15 DIAGNOSIS — I10 Essential (primary) hypertension: Secondary | ICD-10-CM | POA: Diagnosis not present

## 2016-07-15 DIAGNOSIS — E669 Obesity, unspecified: Secondary | ICD-10-CM | POA: Diagnosis not present

## 2016-07-15 DIAGNOSIS — E1165 Type 2 diabetes mellitus with hyperglycemia: Secondary | ICD-10-CM | POA: Diagnosis not present

## 2016-07-15 DIAGNOSIS — I25119 Atherosclerotic heart disease of native coronary artery with unspecified angina pectoris: Secondary | ICD-10-CM | POA: Diagnosis not present

## 2016-07-21 DIAGNOSIS — N6452 Nipple discharge: Secondary | ICD-10-CM | POA: Diagnosis not present

## 2016-07-21 DIAGNOSIS — Z853 Personal history of malignant neoplasm of breast: Secondary | ICD-10-CM | POA: Diagnosis not present

## 2016-07-23 DIAGNOSIS — N6452 Nipple discharge: Secondary | ICD-10-CM | POA: Diagnosis not present

## 2016-08-06 DIAGNOSIS — I252 Old myocardial infarction: Secondary | ICD-10-CM | POA: Diagnosis not present

## 2016-08-06 DIAGNOSIS — N6452 Nipple discharge: Secondary | ICD-10-CM | POA: Diagnosis not present

## 2016-08-06 DIAGNOSIS — Z9049 Acquired absence of other specified parts of digestive tract: Secondary | ICD-10-CM | POA: Diagnosis not present

## 2016-09-03 ENCOUNTER — Other Ambulatory Visit: Payer: Self-pay | Admitting: Surgery

## 2016-09-03 DIAGNOSIS — D241 Benign neoplasm of right breast: Secondary | ICD-10-CM | POA: Diagnosis not present

## 2016-09-03 DIAGNOSIS — N6452 Nipple discharge: Secondary | ICD-10-CM | POA: Diagnosis not present

## 2016-09-21 DIAGNOSIS — H1851 Endothelial corneal dystrophy: Secondary | ICD-10-CM | POA: Diagnosis not present

## 2016-09-21 DIAGNOSIS — E119 Type 2 diabetes mellitus without complications: Secondary | ICD-10-CM | POA: Diagnosis not present

## 2016-09-21 DIAGNOSIS — H43813 Vitreous degeneration, bilateral: Secondary | ICD-10-CM | POA: Diagnosis not present

## 2016-09-21 DIAGNOSIS — H2513 Age-related nuclear cataract, bilateral: Secondary | ICD-10-CM | POA: Diagnosis not present

## 2016-09-28 DIAGNOSIS — Z79899 Other long term (current) drug therapy: Secondary | ICD-10-CM | POA: Diagnosis not present

## 2016-09-28 DIAGNOSIS — I2510 Atherosclerotic heart disease of native coronary artery without angina pectoris: Secondary | ICD-10-CM | POA: Diagnosis not present

## 2016-09-28 DIAGNOSIS — E785 Hyperlipidemia, unspecified: Secondary | ICD-10-CM | POA: Diagnosis not present

## 2016-09-28 DIAGNOSIS — E1165 Type 2 diabetes mellitus with hyperglycemia: Secondary | ICD-10-CM | POA: Diagnosis not present

## 2016-09-28 DIAGNOSIS — K219 Gastro-esophageal reflux disease without esophagitis: Secondary | ICD-10-CM | POA: Diagnosis not present

## 2016-09-28 DIAGNOSIS — I10 Essential (primary) hypertension: Secondary | ICD-10-CM | POA: Diagnosis not present

## 2016-10-05 DIAGNOSIS — Z853 Personal history of malignant neoplasm of breast: Secondary | ICD-10-CM | POA: Diagnosis not present

## 2016-10-08 DIAGNOSIS — K148 Other diseases of tongue: Secondary | ICD-10-CM | POA: Diagnosis not present

## 2016-10-08 DIAGNOSIS — I10 Essential (primary) hypertension: Secondary | ICD-10-CM | POA: Diagnosis not present

## 2016-10-13 DIAGNOSIS — E785 Hyperlipidemia, unspecified: Secondary | ICD-10-CM | POA: Diagnosis not present

## 2016-10-13 DIAGNOSIS — E1165 Type 2 diabetes mellitus with hyperglycemia: Secondary | ICD-10-CM | POA: Diagnosis not present

## 2016-10-13 DIAGNOSIS — I25119 Atherosclerotic heart disease of native coronary artery with unspecified angina pectoris: Secondary | ICD-10-CM | POA: Diagnosis not present

## 2016-10-13 DIAGNOSIS — E1169 Type 2 diabetes mellitus with other specified complication: Secondary | ICD-10-CM | POA: Diagnosis not present

## 2016-10-13 DIAGNOSIS — I10 Essential (primary) hypertension: Secondary | ICD-10-CM | POA: Diagnosis not present

## 2016-10-13 DIAGNOSIS — K760 Fatty (change of) liver, not elsewhere classified: Secondary | ICD-10-CM | POA: Diagnosis not present

## 2016-10-19 DIAGNOSIS — I10 Essential (primary) hypertension: Secondary | ICD-10-CM | POA: Diagnosis not present

## 2016-12-03 DIAGNOSIS — R946 Abnormal results of thyroid function studies: Secondary | ICD-10-CM | POA: Diagnosis not present

## 2017-01-13 DIAGNOSIS — G8929 Other chronic pain: Secondary | ICD-10-CM | POA: Diagnosis not present

## 2017-01-13 DIAGNOSIS — I10 Essential (primary) hypertension: Secondary | ICD-10-CM | POA: Diagnosis not present

## 2017-01-13 DIAGNOSIS — G4701 Insomnia due to medical condition: Secondary | ICD-10-CM

## 2017-01-13 DIAGNOSIS — I25119 Atherosclerotic heart disease of native coronary artery with unspecified angina pectoris: Secondary | ICD-10-CM | POA: Diagnosis not present

## 2017-01-13 DIAGNOSIS — M25511 Pain in right shoulder: Secondary | ICD-10-CM | POA: Diagnosis not present

## 2017-01-13 DIAGNOSIS — E1165 Type 2 diabetes mellitus with hyperglycemia: Secondary | ICD-10-CM | POA: Diagnosis not present

## 2017-01-13 DIAGNOSIS — M25512 Pain in left shoulder: Secondary | ICD-10-CM | POA: Diagnosis not present

## 2017-01-13 DIAGNOSIS — E1169 Type 2 diabetes mellitus with other specified complication: Secondary | ICD-10-CM | POA: Diagnosis not present

## 2017-01-13 DIAGNOSIS — E785 Hyperlipidemia, unspecified: Secondary | ICD-10-CM | POA: Diagnosis not present

## 2017-01-13 HISTORY — DX: Insomnia due to medical condition: G47.01

## 2017-01-19 DIAGNOSIS — Z853 Personal history of malignant neoplasm of breast: Secondary | ICD-10-CM | POA: Diagnosis not present

## 2017-01-21 DIAGNOSIS — Z85810 Personal history of malignant neoplasm of tongue: Secondary | ICD-10-CM | POA: Diagnosis not present

## 2017-01-21 DIAGNOSIS — M8580 Other specified disorders of bone density and structure, unspecified site: Secondary | ICD-10-CM | POA: Diagnosis not present

## 2017-01-21 DIAGNOSIS — Z853 Personal history of malignant neoplasm of breast: Secondary | ICD-10-CM | POA: Diagnosis not present

## 2017-02-08 DIAGNOSIS — I252 Old myocardial infarction: Secondary | ICD-10-CM | POA: Diagnosis not present

## 2017-02-08 DIAGNOSIS — E785 Hyperlipidemia, unspecified: Secondary | ICD-10-CM | POA: Diagnosis not present

## 2017-02-08 DIAGNOSIS — E1169 Type 2 diabetes mellitus with other specified complication: Secondary | ICD-10-CM | POA: Diagnosis not present

## 2017-02-08 DIAGNOSIS — I10 Essential (primary) hypertension: Secondary | ICD-10-CM | POA: Diagnosis not present

## 2017-02-08 DIAGNOSIS — I2510 Atherosclerotic heart disease of native coronary artery without angina pectoris: Secondary | ICD-10-CM | POA: Diagnosis not present

## 2017-03-31 DIAGNOSIS — H1851 Endothelial corneal dystrophy: Secondary | ICD-10-CM | POA: Diagnosis not present

## 2017-03-31 DIAGNOSIS — H25813 Combined forms of age-related cataract, bilateral: Secondary | ICD-10-CM | POA: Diagnosis not present

## 2017-03-31 DIAGNOSIS — E119 Type 2 diabetes mellitus without complications: Secondary | ICD-10-CM | POA: Diagnosis not present

## 2017-04-12 DIAGNOSIS — H25811 Combined forms of age-related cataract, right eye: Secondary | ICD-10-CM | POA: Diagnosis not present

## 2017-04-12 DIAGNOSIS — H2511 Age-related nuclear cataract, right eye: Secondary | ICD-10-CM | POA: Diagnosis not present

## 2017-04-21 DIAGNOSIS — K760 Fatty (change of) liver, not elsewhere classified: Secondary | ICD-10-CM | POA: Diagnosis not present

## 2017-04-21 DIAGNOSIS — E785 Hyperlipidemia, unspecified: Secondary | ICD-10-CM | POA: Diagnosis not present

## 2017-04-21 DIAGNOSIS — E1169 Type 2 diabetes mellitus with other specified complication: Secondary | ICD-10-CM | POA: Diagnosis not present

## 2017-04-21 DIAGNOSIS — I10 Essential (primary) hypertension: Secondary | ICD-10-CM | POA: Diagnosis not present

## 2017-04-21 DIAGNOSIS — E1165 Type 2 diabetes mellitus with hyperglycemia: Secondary | ICD-10-CM | POA: Diagnosis not present

## 2017-04-21 DIAGNOSIS — I25119 Atherosclerotic heart disease of native coronary artery with unspecified angina pectoris: Secondary | ICD-10-CM | POA: Diagnosis not present

## 2017-04-26 DIAGNOSIS — Z79899 Other long term (current) drug therapy: Secondary | ICD-10-CM | POA: Diagnosis not present

## 2017-04-26 DIAGNOSIS — E785 Hyperlipidemia, unspecified: Secondary | ICD-10-CM | POA: Diagnosis not present

## 2017-04-26 DIAGNOSIS — R413 Other amnesia: Secondary | ICD-10-CM | POA: Diagnosis not present

## 2017-04-26 DIAGNOSIS — K219 Gastro-esophageal reflux disease without esophagitis: Secondary | ICD-10-CM | POA: Diagnosis not present

## 2017-04-26 DIAGNOSIS — E875 Hyperkalemia: Secondary | ICD-10-CM | POA: Diagnosis not present

## 2017-04-26 DIAGNOSIS — I10 Essential (primary) hypertension: Secondary | ICD-10-CM | POA: Diagnosis not present

## 2017-04-26 DIAGNOSIS — I2510 Atherosclerotic heart disease of native coronary artery without angina pectoris: Secondary | ICD-10-CM | POA: Diagnosis not present

## 2017-05-20 DIAGNOSIS — H02032 Senile entropion of right lower eyelid: Secondary | ICD-10-CM | POA: Diagnosis not present

## 2017-06-09 DIAGNOSIS — H02002 Unspecified entropion of right lower eyelid: Secondary | ICD-10-CM | POA: Diagnosis not present

## 2017-08-12 ENCOUNTER — Encounter: Payer: Self-pay | Admitting: Cardiology

## 2017-08-12 ENCOUNTER — Ambulatory Visit (INDEPENDENT_AMBULATORY_CARE_PROVIDER_SITE_OTHER): Payer: PPO | Admitting: Cardiology

## 2017-08-12 VITALS — BP 114/66 | HR 72 | Resp 14 | Ht 60.0 in | Wt 183.8 lb

## 2017-08-12 DIAGNOSIS — E785 Hyperlipidemia, unspecified: Secondary | ICD-10-CM | POA: Diagnosis not present

## 2017-08-12 DIAGNOSIS — K760 Fatty (change of) liver, not elsewhere classified: Secondary | ICD-10-CM | POA: Diagnosis not present

## 2017-08-12 DIAGNOSIS — E1169 Type 2 diabetes mellitus with other specified complication: Secondary | ICD-10-CM | POA: Diagnosis not present

## 2017-08-12 DIAGNOSIS — I25119 Atherosclerotic heart disease of native coronary artery with unspecified angina pectoris: Secondary | ICD-10-CM | POA: Diagnosis not present

## 2017-08-12 DIAGNOSIS — E1165 Type 2 diabetes mellitus with hyperglycemia: Secondary | ICD-10-CM | POA: Diagnosis not present

## 2017-08-12 DIAGNOSIS — Z23 Encounter for immunization: Secondary | ICD-10-CM | POA: Diagnosis not present

## 2017-08-12 DIAGNOSIS — I10 Essential (primary) hypertension: Secondary | ICD-10-CM | POA: Diagnosis not present

## 2017-08-12 NOTE — Addendum Note (Signed)
Addended by: Kathyrn Sheriff on: 08/12/2017 10:16 AM   Modules accepted: Orders

## 2017-08-12 NOTE — Progress Notes (Signed)
Cardiology Office Note:    Date:  08/12/2017   ID:  Erika Hamilton, DOB [birth year], MRN [id number]  PCP:  Ernestene Kiel, MD  Cardiologist:  Jenne Campus, MD    Referring MD: Ernestene Kiel, MD   Chief Complaint  Patient presents with  . Follow-up  coronary artery disease  History of Present Illness:    Erika Hamilton is a 83 y.o. female  With history of coronary artery disease, status post small myocardial infarction 2015. Since that time she seems to be doing well cardiac catheterization was done at that time which showed completely occluded distal left anterior descending artery. Since that time she seems to be asymptomatic, no chest pain tightness squeezing pressure burning in the chest. The biggest problem that she has is chronic back pain and that per from doing more than she does now.  Past Medical History:  Diagnosis Date  . Cancer (HCC)    breast, tongue, skin cancer  . Diabetes mellitus without complication ([place])   . GERD (gastroesophageal reflux disease)   . Headache(784.0)   . Heart murmur   . Hx of repair of rotator cuff   . Hypertension   . MI (myocardial infarction) ([place])   . Pancreatitis     Past Surgical History:  Procedure Laterality Date  . BREAST SURGERY Left    lumpectomy  . CHOLECYSTECTOMY N/A 07/09/2014   Procedure: LAPAROSCOPIC CHOLECYSTECTOMY WITH INTRAOPERATIVE CHOLANGIOGRAM;  Surgeon: Shann Medal, MD;  Location: [facility];  Service: General;  Laterality: N/A;  . CORONARY ANGIOPLASTY    . HERNIA REPAIR     umbilical  . SHOULDER SURGERY    . TONGUE SURGERY     removal of cancerous mass  . TONSILLECTOMY      Current Medications: Current Meds  Medication Sig  . aspirin EC 81 MG tablet Take 81 mg by mouth daily.  Marland Kitchen atorvastatin (LIPITOR) 40 MG tablet Take 40 mg by mouth daily.  . Calcium Carbonate-Vit D-Min (CALCIUM/VITAMIN D/MINERALS) 600-200 MG-UNIT TABS Take 1 tablet by mouth daily.  . carvedilol (COREG) 12.5 MG tablet Take 12.5  mg by mouth 2 (two) times daily with a meal.  . Cinnamon 500 MG capsule Take 1,000 mg by mouth daily.  . ferrous sulfate 325 (65 FE) MG tablet Take 325 mg by mouth daily with breakfast.  . fluocinonide cream (LIDEX) 0.86 % Apply 1 application topically 2 (two) times daily as needed (for rash).  . Garlic 578 MG CAPS Take 500 mg by mouth daily.  . Insulin Degludec (TRESIBA FLEXTOUCH [place]) Inject 50 Units into the skin daily.   Marland Kitchen ketoconazole (NIZORAL) 2 % shampoo Apply 1 application topically 2 (two) times a week.   . liver oil-zinc oxide (DESITIN) 40 % ointment Apply 1 application topically as needed for irritation.  . nitroGLYCERIN (NITROSTAT) 0.4 MG SL tablet Place 0.4 mg under the tongue every 5 (five) minutes as needed for chest pain.  . Omega-3 Fatty Acids (FISH OIL) 1000 MG CAPS Take 1,000 mg by mouth daily.  . pantoprazole (PROTONIX) 20 MG tablet Take 20 mg by mouth daily.   Marland Kitchen Propylene Glycol 0.6 % SOLN Apply to eye.  . quinapril (ACCUPRIL) 40 MG tablet Take 40 mg by mouth daily.   Marland Kitchen Specialty Vitamins Products (MAGNESIUM, AMINO ACID CHELATE,) 133 MG tablet Take 1 tablet by mouth daily.     Allergies:   Atorvastatin and Metformin hcl   Social History   Social History  . Marital status: Married  Spouse name: N/A  . Number of children: N/A  . Years of education: N/A   Social History Main Topics  . Smoking status: Never Smoker  . Smokeless tobacco: Never Used  . Alcohol use No  . Drug use: No  . Sexual activity: Not Asked   Other Topics Concern  . None   Social History Narrative  . None     Family History: The patient's family history includes Coronary artery disease in her father; Diabetes in her maternal aunt, maternal uncle, and mother. ROS:   Please see the history of present illness.    All 14 point review of systems negative except as described per history of present illness  EKGs/Labs/Other Studies Reviewed:      Recent Labs: No results found for  requested labs within last 8760 hours.  Recent Lipid Panel No results found for: CHOL, TRIG, HDL, CHOLHDL, VLDL, LDLCALC, LDLDIRECT  Physical Exam:    VS:  BP 114/66   Pulse 72   Resp 14   Ht 5' (1.524 m)   Wt 183 lb 12.8 oz (83.4 kg)   BMI 35.90 kg/m     Wt Readings from Last 3 Encounters:  08/12/17 183 lb 12.8 oz (83.4 kg)  07/09/14 177 lb (80.3 kg)  07/05/14 177 lb (80.3 kg)     GEN:  Well nourished, well developed in no acute distress HEENT: Normal NECK: No JVD; No carotid bruits LYMPHATICS: No lymphadenopathy CARDIAC: RRR, holosystolic murmur grade 9-6/7 best heard left part of his sternum, no rubs, no gallops RESPIRATORY:  Clear to auscultation without rales, wheezing or rhonchi  ABDOMEN: Soft, non-tender, non-distended MUSCULOSKELETAL:  No edema; No deformity  SKIN: Warm and dry LOWER EXTREMITIES: no swelling NEUROLOGIC:  Alert and oriented x 3 PSYCHIATRIC:  Normal affect   ASSESSMENT:    1. Coronary artery disease involving native coronary artery of native heart with angina pectoris ([place])   2. Essential hypertension   3. Hyperlipidemia associated with type 2 diabetes mellitus ([place])    PLAN:    In order of problems listed above:  1. Coronary artery disease:Stable we'll continue present management. I will check her EKG. 2. Essential hypertension: Blood  Pressure well-controlled, we'll continue present management. 3. Hyperlipidemia: We'll call primary care physician to get fasting lipid profile. 4. Mitral regurgitation: Echocardiography will be done to assess the lesion.   Medication Adjustments/Labs and Tests Ordered: Current medicines are reviewed at length with the patient today.  Concerns regarding medicines are outlined above.  No orders of the defined types were placed in this encounter.  Medication changes: No orders of the defined types were placed in this encounter.   Signed, Park Liter, MD, [facility] 08/12/2017 10:07 AM    [place]

## 2017-08-12 NOTE — Patient Instructions (Signed)
Medication Instructions:  Your physician recommends that you continue on your current medications as directed. Please refer to the Current Medication list given to you today.  Labwork: None    Testing/Procedures: EKG in office   Your physician has requested that you have an echocardiogram. Echocardiography is a painless test that uses sound waves to create images of your heart. It provides your doctor with information about the size and shape of your heart and how well your heart's chambers and valves are working. This procedure takes approximately one hour. There are no restrictions for this procedure.  Follow-Up: Your physician wants you to follow-up in: 6 months. You will receive a reminder letter in the mail two months in advance. If you don't receive a letter, please call our office to schedule the follow-up appointment.  Any Other Special Instructions Will Be Listed Below (If Applicable).  Please note that any paperwork needing to be filled out by the provider will need to be addressed at the front desk prior to seeing the provider. Please note that any paperwork FMLA, Disability or other documents regarding health condition is subject to a $25.00 charge that must be received prior to completion of paperwork in the form of a money order or check.    If you need a refill on your cardiac medications before your next appointment, please call your pharmacy.

## 2017-10-20 DIAGNOSIS — I2510 Atherosclerotic heart disease of native coronary artery without angina pectoris: Secondary | ICD-10-CM | POA: Diagnosis not present

## 2017-10-20 DIAGNOSIS — E785 Hyperlipidemia, unspecified: Secondary | ICD-10-CM | POA: Diagnosis not present

## 2017-10-20 DIAGNOSIS — I10 Essential (primary) hypertension: Secondary | ICD-10-CM | POA: Diagnosis not present

## 2017-10-20 DIAGNOSIS — Z79899 Other long term (current) drug therapy: Secondary | ICD-10-CM | POA: Diagnosis not present

## 2017-10-20 DIAGNOSIS — L84 Corns and callosities: Secondary | ICD-10-CM | POA: Diagnosis not present

## 2017-10-20 DIAGNOSIS — K219 Gastro-esophageal reflux disease without esophagitis: Secondary | ICD-10-CM | POA: Diagnosis not present

## 2017-10-20 DIAGNOSIS — E1165 Type 2 diabetes mellitus with hyperglycemia: Secondary | ICD-10-CM | POA: Diagnosis not present

## 2017-10-20 DIAGNOSIS — M791 Myalgia, unspecified site: Secondary | ICD-10-CM | POA: Diagnosis not present

## 2017-11-05 DIAGNOSIS — J029 Acute pharyngitis, unspecified: Secondary | ICD-10-CM | POA: Diagnosis not present

## 2017-11-05 DIAGNOSIS — J019 Acute sinusitis, unspecified: Secondary | ICD-10-CM | POA: Diagnosis not present

## 2017-11-05 DIAGNOSIS — J40 Bronchitis, not specified as acute or chronic: Secondary | ICD-10-CM | POA: Diagnosis not present

## 2017-11-30 DIAGNOSIS — J40 Bronchitis, not specified as acute or chronic: Secondary | ICD-10-CM | POA: Diagnosis not present

## 2017-11-30 DIAGNOSIS — R0981 Nasal congestion: Secondary | ICD-10-CM | POA: Diagnosis not present

## 2017-11-30 DIAGNOSIS — R05 Cough: Secondary | ICD-10-CM | POA: Diagnosis not present

## 2017-12-14 DIAGNOSIS — I25119 Atherosclerotic heart disease of native coronary artery with unspecified angina pectoris: Secondary | ICD-10-CM | POA: Diagnosis not present

## 2017-12-14 DIAGNOSIS — E1165 Type 2 diabetes mellitus with hyperglycemia: Secondary | ICD-10-CM | POA: Diagnosis not present

## 2017-12-14 DIAGNOSIS — I10 Essential (primary) hypertension: Secondary | ICD-10-CM | POA: Diagnosis not present

## 2017-12-20 DIAGNOSIS — L918 Other hypertrophic disorders of the skin: Secondary | ICD-10-CM | POA: Diagnosis not present

## 2017-12-20 DIAGNOSIS — R233 Spontaneous ecchymoses: Secondary | ICD-10-CM | POA: Diagnosis not present

## 2018-01-04 DIAGNOSIS — M8589 Other specified disorders of bone density and structure, multiple sites: Secondary | ICD-10-CM | POA: Diagnosis not present

## 2018-01-04 DIAGNOSIS — Z853 Personal history of malignant neoplasm of breast: Secondary | ICD-10-CM | POA: Diagnosis not present

## 2018-01-04 DIAGNOSIS — M8580 Other specified disorders of bone density and structure, unspecified site: Secondary | ICD-10-CM | POA: Diagnosis not present

## 2018-01-04 DIAGNOSIS — Z85818 Personal history of malignant neoplasm of other sites of lip, oral cavity, and pharynx: Secondary | ICD-10-CM | POA: Diagnosis not present

## 2018-01-04 DIAGNOSIS — Z85810 Personal history of malignant neoplasm of tongue: Secondary | ICD-10-CM | POA: Diagnosis not present

## 2018-01-26 DIAGNOSIS — Z853 Personal history of malignant neoplasm of breast: Secondary | ICD-10-CM | POA: Diagnosis not present

## 2018-01-26 DIAGNOSIS — Z5111 Encounter for antineoplastic chemotherapy: Secondary | ICD-10-CM | POA: Diagnosis not present

## 2018-01-29 DIAGNOSIS — E119 Type 2 diabetes mellitus without complications: Secondary | ICD-10-CM | POA: Diagnosis not present

## 2018-03-24 DIAGNOSIS — H17822 Peripheral opacity of cornea, left eye: Secondary | ICD-10-CM | POA: Diagnosis not present

## 2018-03-24 DIAGNOSIS — H25812 Combined forms of age-related cataract, left eye: Secondary | ICD-10-CM | POA: Diagnosis not present

## 2018-03-24 DIAGNOSIS — E119 Type 2 diabetes mellitus without complications: Secondary | ICD-10-CM | POA: Diagnosis not present

## 2018-03-24 DIAGNOSIS — H1859 Other hereditary corneal dystrophies: Secondary | ICD-10-CM | POA: Diagnosis not present

## 2018-03-24 DIAGNOSIS — H353121 Nonexudative age-related macular degeneration, left eye, early dry stage: Secondary | ICD-10-CM | POA: Diagnosis not present

## 2018-03-24 DIAGNOSIS — H1851 Endothelial corneal dystrophy: Secondary | ICD-10-CM | POA: Diagnosis not present

## 2018-03-24 DIAGNOSIS — Z961 Presence of intraocular lens: Secondary | ICD-10-CM | POA: Diagnosis not present

## 2018-03-29 DIAGNOSIS — H2512 Age-related nuclear cataract, left eye: Secondary | ICD-10-CM | POA: Diagnosis not present

## 2018-04-04 DIAGNOSIS — H2512 Age-related nuclear cataract, left eye: Secondary | ICD-10-CM | POA: Diagnosis not present

## 2018-04-04 DIAGNOSIS — H25812 Combined forms of age-related cataract, left eye: Secondary | ICD-10-CM | POA: Diagnosis not present

## 2018-04-19 DIAGNOSIS — Z79899 Other long term (current) drug therapy: Secondary | ICD-10-CM | POA: Diagnosis not present

## 2018-04-19 DIAGNOSIS — E1165 Type 2 diabetes mellitus with hyperglycemia: Secondary | ICD-10-CM | POA: Diagnosis not present

## 2018-04-19 DIAGNOSIS — G4700 Insomnia, unspecified: Secondary | ICD-10-CM | POA: Diagnosis not present

## 2018-04-19 DIAGNOSIS — I10 Essential (primary) hypertension: Secondary | ICD-10-CM | POA: Diagnosis not present

## 2018-04-19 DIAGNOSIS — E785 Hyperlipidemia, unspecified: Secondary | ICD-10-CM | POA: Diagnosis not present

## 2018-04-19 DIAGNOSIS — K219 Gastro-esophageal reflux disease without esophagitis: Secondary | ICD-10-CM | POA: Diagnosis not present

## 2018-04-30 DIAGNOSIS — E119 Type 2 diabetes mellitus without complications: Secondary | ICD-10-CM | POA: Diagnosis not present

## 2018-05-04 DIAGNOSIS — E1165 Type 2 diabetes mellitus with hyperglycemia: Secondary | ICD-10-CM | POA: Diagnosis not present

## 2018-05-04 DIAGNOSIS — E1169 Type 2 diabetes mellitus with other specified complication: Secondary | ICD-10-CM | POA: Diagnosis not present

## 2018-05-04 DIAGNOSIS — I10 Essential (primary) hypertension: Secondary | ICD-10-CM | POA: Diagnosis not present

## 2018-05-04 DIAGNOSIS — Z634 Disappearance and death of family member: Secondary | ICD-10-CM

## 2018-05-04 HISTORY — DX: Disappearance and death of family member: Z63.4

## 2018-07-22 DIAGNOSIS — E785 Hyperlipidemia, unspecified: Secondary | ICD-10-CM | POA: Diagnosis not present

## 2018-07-22 DIAGNOSIS — I25119 Atherosclerotic heart disease of native coronary artery with unspecified angina pectoris: Secondary | ICD-10-CM | POA: Diagnosis not present

## 2018-07-22 DIAGNOSIS — E1169 Type 2 diabetes mellitus with other specified complication: Secondary | ICD-10-CM | POA: Diagnosis not present

## 2018-07-22 DIAGNOSIS — I10 Essential (primary) hypertension: Secondary | ICD-10-CM | POA: Diagnosis not present

## 2018-07-30 DIAGNOSIS — E119 Type 2 diabetes mellitus without complications: Secondary | ICD-10-CM | POA: Diagnosis not present

## 2018-08-08 ENCOUNTER — Other Ambulatory Visit: Payer: Self-pay

## 2018-08-08 ENCOUNTER — Ambulatory Visit: Payer: PPO | Admitting: Podiatry

## 2018-08-08 ENCOUNTER — Ambulatory Visit (INDEPENDENT_AMBULATORY_CARE_PROVIDER_SITE_OTHER): Payer: PPO

## 2018-08-08 ENCOUNTER — Encounter: Payer: Self-pay | Admitting: Podiatry

## 2018-08-08 VITALS — BP 151/76 | HR 73 | Resp 15 | Ht 60.0 in | Wt 180.0 lb

## 2018-08-08 DIAGNOSIS — S9032XA Contusion of left foot, initial encounter: Secondary | ICD-10-CM | POA: Diagnosis not present

## 2018-08-08 DIAGNOSIS — S99922A Unspecified injury of left foot, initial encounter: Secondary | ICD-10-CM | POA: Diagnosis not present

## 2018-08-08 DIAGNOSIS — M79672 Pain in left foot: Secondary | ICD-10-CM

## 2018-08-08 DIAGNOSIS — R600 Localized edema: Secondary | ICD-10-CM

## 2018-08-08 NOTE — Progress Notes (Signed)
Subjective:  Patient ID: Erika Hamilton, female    DOB: 02/13/1933,  MRN: [id number]  Chief Complaint  Patient presents with  . Foot Injury    L foot injury ( droppped a salad bottle ) x 2 wks; 6/10 pain when walking Tx: elevation Pt. stated," it's been bruised and it hurts to walk."     84 y.o. female presents with the above complaint.  Reports injury to the top of the left foot approximately 2 and half weeks ago.  States that she dropped an empty salad bottle on the foot.  Endorses that the foot was bruised and swollen.  Endorses 5 out of 10 pain while walking.  Has tried elevating it.  Has not iced it.  Diabetic with last fasting blood glucose of 176 yesterday.  Last A1c was little over 8.  Cc PCP Dr. Sandrea Hughs last seen 3 weeks ago.  Review of Systems: Negative except as noted in the HPI. Denies N/V/F/Ch.  Past Medical History:  Diagnosis Date  . Cancer (HCC)    breast, tongue, skin cancer  . Diabetes mellitus without complication ([place])   . GERD (gastroesophageal reflux disease)   . Headache(784.0)   . Heart murmur   . Hx of repair of rotator cuff   . Hypertension   . MI (myocardial infarction) ([place])   . Pancreatitis     Current Outpatient Medications:  .  aspirin EC 81 MG tablet, Take 81 mg by mouth daily., Disp: , Rfl:  .  atorvastatin (LIPITOR) 40 MG tablet, Take 40 mg by mouth daily., Disp: , Rfl:  .  carvedilol (COREG) 12.5 MG tablet, Take 25 mg by mouth 2 (two) times daily with a meal. , Disp: , Rfl:  .  Melatonin 1 MG CAPS, Take by mouth., Disp: , Rfl:  .  pantoprazole (PROTONIX) 20 MG tablet, Take 20 mg by mouth daily. , Disp: , Rfl:  .  quinapril (ACCUPRIL) 40 MG tablet, Take 40 mg by mouth daily. , Disp: , Rfl:  .  Specialty Vitamins Products (MAGNESIUM, AMINO ACID CHELATE,) 133 MG tablet, Take 1 tablet by mouth daily., Disp: , Rfl:   Social History   Tobacco Use  Smoking Status Never Smoker  Smokeless Tobacco Never Used    Allergies  Allergen Reactions  .  Atorvastatin   . Metformin Hcl    Objective:   Vitals:   08/08/18 0954  BP: (!) 151/76  Pulse: 73  Resp: 15   Body mass index is 35.15 kg/m. Constitutional Well developed. Well nourished.  Vascular Dorsalis pedis pulses palpable bilaterally. Posterior tibial pulses palpable bilaterally. Capillary refill normal to all digits.  No cyanosis or clubbing noted. Pedal hair growth normal.  Neurologic Normal speech. Oriented to person, place, and time. Epicritic sensation to light touch grossly present bilaterally.  Dermatologic Nails well groomed and normal in appearance. No open wounds. No skin lesions.  Orthopedic: Normal joint ROM without pain or crepitus bilaterally. No visible deformities. Pain palpation about the first metatarsal base.  Contusion noted.   Radiographs: Taken and reviewed.  No acute fracture dislocations. Assessment:   1. Contusion of left foot, initial encounter   2. Localized edema   3. Pain in left foot   4. Injury of left foot, initial encounter    Plan:  Patient was evaluated and treated and all questions answered.  Contusion left foot localized edema -X-rays negative reviewed as above -Soft cast consisting of Unna boot and Coban applied today for reduction of swelling  and inflammation -Educated on icing of the affected limb -Follow-up in 4 weeks for repeat x-rays should pain persist  Return in about 4 weeks (around 09/05/2018) for contusion f/u.

## 2018-08-08 NOTE — Progress Notes (Signed)
   Subjective:    Patient ID: Erika Hamilton, female    DOB: [birth year], 84 y.o.   MRN: [id number]  HPI    Review of Systems  Musculoskeletal: Positive for arthralgias, gait problem, joint swelling and myalgias.  All other systems reviewed and are negative.      Objective:   Physical Exam        Assessment & Plan:

## 2018-08-09 ENCOUNTER — Other Ambulatory Visit: Payer: Self-pay | Admitting: Podiatry

## 2018-08-09 DIAGNOSIS — M79672 Pain in left foot: Secondary | ICD-10-CM

## 2018-08-09 DIAGNOSIS — S9032XA Contusion of left foot, initial encounter: Secondary | ICD-10-CM

## 2018-08-09 DIAGNOSIS — S99922A Unspecified injury of left foot, initial encounter: Secondary | ICD-10-CM

## 2018-08-09 DIAGNOSIS — R600 Localized edema: Secondary | ICD-10-CM

## 2018-09-05 ENCOUNTER — Ambulatory Visit: Payer: PPO | Admitting: Podiatry

## 2018-09-22 DIAGNOSIS — Z0000 Encounter for general adult medical examination without abnormal findings: Secondary | ICD-10-CM | POA: Diagnosis not present

## 2018-09-22 DIAGNOSIS — Z23 Encounter for immunization: Secondary | ICD-10-CM | POA: Diagnosis not present

## 2018-09-22 DIAGNOSIS — Z1331 Encounter for screening for depression: Secondary | ICD-10-CM | POA: Diagnosis not present

## 2018-09-22 DIAGNOSIS — I2510 Atherosclerotic heart disease of native coronary artery without angina pectoris: Secondary | ICD-10-CM | POA: Diagnosis not present

## 2018-09-22 DIAGNOSIS — Z79899 Other long term (current) drug therapy: Secondary | ICD-10-CM | POA: Diagnosis not present

## 2018-09-22 DIAGNOSIS — Z7189 Other specified counseling: Secondary | ICD-10-CM | POA: Diagnosis not present

## 2018-09-22 DIAGNOSIS — Z6835 Body mass index (BMI) 35.0-35.9, adult: Secondary | ICD-10-CM | POA: Diagnosis not present

## 2018-09-22 DIAGNOSIS — E785 Hyperlipidemia, unspecified: Secondary | ICD-10-CM | POA: Diagnosis not present

## 2018-09-22 DIAGNOSIS — Z1339 Encounter for screening examination for other mental health and behavioral disorders: Secondary | ICD-10-CM | POA: Diagnosis not present

## 2018-09-22 DIAGNOSIS — I10 Essential (primary) hypertension: Secondary | ICD-10-CM | POA: Diagnosis not present

## 2018-10-05 DIAGNOSIS — Z1211 Encounter for screening for malignant neoplasm of colon: Secondary | ICD-10-CM | POA: Diagnosis not present

## 2018-10-11 ENCOUNTER — Ambulatory Visit (INDEPENDENT_AMBULATORY_CARE_PROVIDER_SITE_OTHER): Payer: PPO | Admitting: Cardiology

## 2018-10-11 ENCOUNTER — Encounter: Payer: Self-pay | Admitting: Cardiology

## 2018-10-11 VITALS — BP 130/72 | HR 71 | Ht 60.0 in | Wt 180.4 lb

## 2018-10-11 DIAGNOSIS — I25119 Atherosclerotic heart disease of native coronary artery with unspecified angina pectoris: Secondary | ICD-10-CM | POA: Diagnosis not present

## 2018-10-11 DIAGNOSIS — I10 Essential (primary) hypertension: Secondary | ICD-10-CM | POA: Diagnosis not present

## 2018-10-11 DIAGNOSIS — E785 Hyperlipidemia, unspecified: Secondary | ICD-10-CM

## 2018-10-11 NOTE — Patient Instructions (Signed)
Medication Instructions:  Your physician recommends that you continue on your current medications as directed. Please refer to the Current Medication list given to you today.  If you need a refill on your cardiac medications before your next appointment, please call your pharmacy.   Lab work: None.  If you have labs (blood work) drawn today and your tests are completely normal, you will receive your results only by: Marland Kitchen MyChart Message (if you have MyChart) OR . A paper copy in the mail If you have any lab test that is abnormal or we need to change your treatment, we will call you to review the results.  Testing/Procedures: Your physician has requested that you have an echocardiogram. Echocardiography is a painless test that uses sound waves to create images of your heart. It provides your doctor with information about the size and shape of your heart and how well your heart's chambers and valves are working. This procedure takes approximately one hour. There are no restrictions for this procedure.    Follow-Up: At [facility], you and your health needs are our priority.  As part of our continuing mission to provide you with exceptional heart care, we have created designated Provider Care Teams.  These Care Teams include your primary Cardiologist (physician) and Advanced Practice Providers (APPs -  Physician Assistants and Nurse Practitioners) who all work together to provide you with the care you need, when you need it. You will need a follow up appointment in 6 months.  Please call our office 2 months in advance to schedule this appointment.  You may see Jenne Campus, MD or another member of our [place] Provider Team in [place]: Shirlee More, MD . Jyl Heinz, MD  Any Other Special Instructions Will Be Listed Below (If Applicable).  Echocardiogram An echocardiogram, or echocardiography, uses sound waves (ultrasound) to produce an image of your heart. The echocardiogram is  simple, painless, obtained within a short period of time, and offers valuable information to your health care provider. The images from an echocardiogram can provide information such as:  Evidence of coronary artery disease (CAD).  Heart size.  Heart muscle function.  Heart valve function.  Aneurysm detection.  Evidence of a past heart attack.  Fluid buildup around the heart.  Heart muscle thickening.  Assess heart valve function.  Tell a health care provider about:  Any allergies you have.  All medicines you are taking, including vitamins, herbs, eye drops, creams, and over-the-counter medicines.  Any problems you or family members have had with anesthetic medicines.  Any blood disorders you have.  Any surgeries you have had.  Any medical conditions you have.  Whether you are pregnant or may be pregnant. What happens before the procedure? No special preparation is needed. Eat and drink normally. What happens during the procedure?  In order to produce an image of your heart, gel will be applied to your chest and a wand-like tool (transducer) will be moved over your chest. The gel will help transmit the sound waves from the transducer. The sound waves will harmlessly bounce off your heart to allow the heart images to be captured in real-time motion. These images will then be recorded.  You may need an IV to receive a medicine that improves the quality of the pictures. What happens after the procedure? You may return to your normal schedule including diet, activities, and medicines, unless your health care provider tells you otherwise. This information is not intended to replace advice given to you  by your health care provider. Make sure you discuss any questions you have with your health care provider. Document Released: 11/06/2000 Document Revised: 06/27/2016 Document Reviewed: 07/17/2013 Elsevier Interactive Patient Education  2017 Elsevier Inc.    

## 2018-10-11 NOTE — Progress Notes (Signed)
Cardiology Office Note:    Date:  10/11/2018   ID:  [name], DOB Mar 14, 1933, MRN [id number]  PCP:  Ernestene Kiel, MD  Cardiologist:  Jenne Campus, MD    Referring MD: Ernestene Kiel, MD   Chief Complaint  Patient presents with  . Follow-up  I am doing well cardiac wise  History of Present Illness:    Erika Hamilton is a 84 y.o. female with coronary artery disease, status post cardiac catheterization September 2015 when she was find to have completely occluded distal LAD she did have non-STEMI no stent implanted also dyslipidemia hypertension diabetes overall cardiac wise she is doing well she did have tragic accident in the family her son Shanon Brow was killed in accident obviously she is grieving after that.  No chest pain tightness squeezing pressure burning chest no shortness of breath.  Past Medical History:  Diagnosis Date  . Cancer (HCC)    breast, tongue, skin cancer  . Diabetes mellitus without complication ([place])   . GERD (gastroesophageal reflux disease)   . Headache(784.0)   . Heart murmur   . Hx of repair of rotator cuff   . Hypertension   . MI (myocardial infarction) ([place])   . Pancreatitis     Past Surgical History:  Procedure Laterality Date  . BREAST SURGERY Left    lumpectomy  . CHOLECYSTECTOMY N/A 07/09/2014   Procedure: LAPAROSCOPIC CHOLECYSTECTOMY WITH INTRAOPERATIVE CHOLANGIOGRAM;  Surgeon: Shann Medal, MD;  Location: [place];  Service: General;  Laterality: N/A;  . CORONARY ANGIOPLASTY    . HERNIA REPAIR     umbilical  . SHOULDER SURGERY    . TONGUE SURGERY     removal of cancerous mass  . TONSILLECTOMY      Current Medications: Current Meds  Medication Sig  . aspirin EC 81 MG tablet Take 81 mg by mouth daily.  Marland Kitchen atorvastatin (LIPITOR) 40 MG tablet Take 40 mg by mouth daily.  . carvedilol (COREG) 12.5 MG tablet Take 25 mg by mouth 2 (two) times daily with a meal.   . insulin degludec (TRESIBA FLEXTOUCH) 100 UNIT/ML SOPN  FlexTouch Pen Inject 60 Units into the skin daily.  . Melatonin 1 MG CAPS Take by mouth.  . pantoprazole (PROTONIX) 20 MG tablet Take 20 mg by mouth daily.   . quinapril (ACCUPRIL) 40 MG tablet Take 40 mg by mouth daily.   Marland Kitchen Specialty Vitamins Products (MAGNESIUM, AMINO ACID CHELATE,) 133 MG tablet Take 1 tablet by mouth daily.     Allergies:   Atorvastatin and Metformin hcl   Social History   Socioeconomic History  . Marital status: Married    Spouse name: Not on file  . Number of children: Not on file  . Years of education: Not on file  . Highest education level: Not on file  Occupational History  . Not on file  Social Needs  . Financial resource strain: Not on file  . Food insecurity:    Worry: Not on file    Inability: Not on file  . Transportation needs:    Medical: Not on file    Non-medical: Not on file  Tobacco Use  . Smoking status: Never Smoker  . Smokeless tobacco: Never Used  Substance and Sexual Activity  . Alcohol use: No  . Drug use: No  . Sexual activity: Not on file  Lifestyle  . Physical activity:    Days per week: Not on file    Minutes per session: Not on file  .  Stress: Not on file  Relationships  . Social connections:    Talks on phone: Not on file    Gets together: Not on file    Attends religious service: Not on file    Active member of club or organization: Not on file    Attends meetings of clubs or organizations: Not on file    Relationship status: Not on file  Other Topics Concern  . Not on file  Social History Narrative  . Not on file     Family History: The patient's family history includes Coronary artery disease in her father; Diabetes in her maternal aunt, maternal uncle, and mother. ROS:   Please see the history of present illness.    All 14 point review of systems negative except as described per history of present illness  EKGs/Labs/Other Studies Reviewed:    EKG today showed normal sinus rhythm incomplete left bundle  branch block left axis deviation criteria for LVH.  Recent Labs: No results found for requested labs within last 8760 hours.  Recent Lipid Panel No results found for: CHOL, TRIG, HDL, CHOLHDL, VLDL, LDLCALC, LDLDIRECT  Physical Exam:    VS:  BP 130/72   Pulse 71   Ht 5' (1.524 m)   Wt 180 lb 6.4 oz (81.8 kg)   SpO2 95%   BMI 35.23 kg/m     Wt Readings from Last 3 Encounters:  10/11/18 180 lb 6.4 oz (81.8 kg)  08/08/18 180 lb (81.6 kg)  08/12/17 183 lb 12.8 oz (83.4 kg)     GEN:  Well nourished, well developed in no acute distress HEENT: Normal NECK: No JVD; No carotid bruits LYMPHATICS: No lymphadenopathy CARDIAC: RRR, holosystolic murmur grade 3/6 best heard left border sternum with radiation towards axilla, no rubs, no gallops RESPIRATORY:  Clear to auscultation without rales, wheezing or rhonchi  ABDOMEN: Soft, non-tender, non-distended MUSCULOSKELETAL:  No edema; No deformity  SKIN: Warm and dry LOWER EXTREMITIES: no swelling NEUROLOGIC:  Alert and oriented x 3 PSYCHIATRIC:  Normal affect   ASSESSMENT:    1. Coronary artery disease involving native coronary artery of native heart with angina pectoris ([place])   2. Essential hypertension   3. Dyslipidemia    PLAN:    In order of problems listed above:  1. Coronary artery disease doing well from that point review no symptoms. 2. Mitral regurgitation should be scheduled to have an echocardiogram to assess the lesion.  Last time she did not do it 3. Essential hypertension blood pressure well controlled continue present management. 4. Dyslipidemia will call primary care physician to get her fasting lipid profile.   Medication Adjustments/Labs and Tests Ordered: Current medicines are reviewed at length with the patient today.  Concerns regarding medicines are outlined above.  No orders of the defined types were placed in this encounter.  Medication changes: No orders of the defined types were placed in this  encounter.   Signed, Park Liter, MD, [facility] 10/11/2018 11:44 AM    [place]

## 2018-10-14 ENCOUNTER — Telehealth: Payer: Self-pay | Admitting: Cardiology

## 2018-10-14 MED ORDER — NITROGLYCERIN 0.4 MG SL SUBL: 0.4000 mg | SUBLINGUAL_TABLET | SUBLINGUAL | 9 refills | Status: DC | PRN

## 2018-10-14 NOTE — Telephone Encounter (Signed)
Per Dr. Agustin Cree Nitroglycerin 0.4 mg tablet as needed for chest pain ordered for patient.

## 2018-10-14 NOTE — Telephone Encounter (Signed)
Call nitro to [facility] in [place]

## 2018-11-24 DIAGNOSIS — Z961 Presence of intraocular lens: Secondary | ICD-10-CM | POA: Diagnosis not present

## 2018-11-28 ENCOUNTER — Ambulatory Visit (INDEPENDENT_AMBULATORY_CARE_PROVIDER_SITE_OTHER): Payer: PPO

## 2018-11-28 DIAGNOSIS — I25119 Atherosclerotic heart disease of native coronary artery with unspecified angina pectoris: Secondary | ICD-10-CM | POA: Diagnosis not present

## 2018-11-28 NOTE — Progress Notes (Signed)
Complete echocardiogram has been performed.  Jimmy Araceli Arango RDCS, RVT 

## 2018-12-08 DIAGNOSIS — I10 Essential (primary) hypertension: Secondary | ICD-10-CM | POA: Diagnosis not present

## 2018-12-08 DIAGNOSIS — K760 Fatty (change of) liver, not elsewhere classified: Secondary | ICD-10-CM | POA: Diagnosis not present

## 2018-12-08 DIAGNOSIS — E1165 Type 2 diabetes mellitus with hyperglycemia: Secondary | ICD-10-CM | POA: Diagnosis not present

## 2018-12-08 DIAGNOSIS — E782 Mixed hyperlipidemia: Secondary | ICD-10-CM | POA: Diagnosis not present

## 2019-01-30 DIAGNOSIS — Z853 Personal history of malignant neoplasm of breast: Secondary | ICD-10-CM | POA: Diagnosis not present

## 2019-01-30 DIAGNOSIS — Z1231 Encounter for screening mammogram for malignant neoplasm of breast: Secondary | ICD-10-CM | POA: Diagnosis not present

## 2019-02-06 DIAGNOSIS — E785 Hyperlipidemia, unspecified: Secondary | ICD-10-CM | POA: Diagnosis not present

## 2019-02-06 DIAGNOSIS — I10 Essential (primary) hypertension: Secondary | ICD-10-CM | POA: Diagnosis not present

## 2019-02-06 DIAGNOSIS — R42 Dizziness and giddiness: Secondary | ICD-10-CM | POA: Diagnosis not present

## 2019-02-06 DIAGNOSIS — E1165 Type 2 diabetes mellitus with hyperglycemia: Secondary | ICD-10-CM | POA: Diagnosis not present

## 2019-02-06 DIAGNOSIS — K219 Gastro-esophageal reflux disease without esophagitis: Secondary | ICD-10-CM | POA: Diagnosis not present

## 2019-02-06 DIAGNOSIS — Z79899 Other long term (current) drug therapy: Secondary | ICD-10-CM | POA: Diagnosis not present

## 2019-02-06 DIAGNOSIS — Z6835 Body mass index (BMI) 35.0-35.9, adult: Secondary | ICD-10-CM | POA: Diagnosis not present

## 2019-02-06 DIAGNOSIS — I2510 Atherosclerotic heart disease of native coronary artery without angina pectoris: Secondary | ICD-10-CM | POA: Diagnosis not present

## 2019-02-06 DIAGNOSIS — R51 Headache: Secondary | ICD-10-CM | POA: Diagnosis not present

## 2019-02-23 ENCOUNTER — Other Ambulatory Visit: Payer: Self-pay | Admitting: *Deleted

## 2019-02-23 NOTE — Patient Outreach (Addendum)
[facility]) Care Management  02/23/2019  Erika Hamilton [birth year] [id number]   Subjective: Telephone call to patient's home number, spoke with patient, and HIPAA verified.  Discussed [facility] Care Management [employer] Nurse Call Line follow up, patient voiced understanding, and is in agreement to follow up.  Patient states she has been trying everything to get the ear wax out, not working, aware of not to put things / substances (hydrogen peroxide) in her ears, and is planning to call primary MD's office to arrange an in person visit within social distancing if possible.  States she is in high risk group for the "virus" and scare of catching something.  RNCM advised patient to discuss her concerns with MD office and see what they could to accommodate her with social distancing, via in person visit.   Patient voices understanding, is agreement, and is planning to call MD office as soon as possible.   Patient states she does not have any education material, nurse call line, care coordination, disease management, disease monitoring, transportation, community resource, or pharmacy needs at this time.   States she is very appreciative of the follow up.    Objective: Per KPN (Knowledge Performance Now, point of care tool) and chart review, patient has had no recent hospitalization or ED visits.   Patient has a history of diabetes, pancreatitis, hiatal hernia, tongue cancer, and breast cancer.    Assessment: Received [employer] Nurse Call Line follow up referral on 02/22/2019.  Referral reason: Patient with ear wax in both ears, referred to primary MD within 3 days of call by nurse line,  to nurse line.  Nurse line follow up completed and no further care management needs.       Plan: RNCM will complete case closure due to follow up completed / no care management needs.      Kiyani Jernigan H. Annia Friendly, BSN, [facility] Telephonic CM Phone:  [phone] Fax: [phone]

## 2019-03-14 DIAGNOSIS — Z6835 Body mass index (BMI) 35.0-35.9, adult: Secondary | ICD-10-CM | POA: Diagnosis not present

## 2019-03-14 DIAGNOSIS — R700 Elevated erythrocyte sedimentation rate: Secondary | ICD-10-CM | POA: Diagnosis not present

## 2019-03-14 DIAGNOSIS — H6120 Impacted cerumen, unspecified ear: Secondary | ICD-10-CM | POA: Diagnosis not present

## 2019-03-14 DIAGNOSIS — R51 Headache: Secondary | ICD-10-CM | POA: Diagnosis not present

## 2019-05-10 ENCOUNTER — Other Ambulatory Visit: Payer: Self-pay

## 2019-05-10 ENCOUNTER — Telehealth (INDEPENDENT_AMBULATORY_CARE_PROVIDER_SITE_OTHER): Payer: PPO | Admitting: Cardiology

## 2019-05-10 ENCOUNTER — Encounter: Payer: Self-pay | Admitting: Cardiology

## 2019-05-10 VITALS — BP 146/53 | HR 67 | Wt 179.0 lb

## 2019-05-10 DIAGNOSIS — K760 Fatty (change of) liver, not elsewhere classified: Secondary | ICD-10-CM

## 2019-05-10 DIAGNOSIS — I25119 Atherosclerotic heart disease of native coronary artery with unspecified angina pectoris: Secondary | ICD-10-CM

## 2019-05-10 DIAGNOSIS — E785 Hyperlipidemia, unspecified: Secondary | ICD-10-CM | POA: Diagnosis not present

## 2019-05-10 DIAGNOSIS — I10 Essential (primary) hypertension: Secondary | ICD-10-CM

## 2019-05-10 NOTE — Patient Instructions (Addendum)
Medication Instructions:  Your physician recommends that you continue on your current medications as directed. Please refer to the Current Medication list given to you today.  If you need a refill on your cardiac medications before your next appointment, please call your pharmacy.   Lab work: None.  If you have labs (blood work) drawn today and your tests are completely normal, you will receive your results only by: . MyChart Message (if you have MyChart) OR . A paper copy in the mail If you have any lab test that is abnormal or we need to change your treatment, we will call you to review the results.  Testing/Procedures: None.   Follow-Up: At [facility], you and your health needs are our priority.  As part of our continuing mission to provide you with exceptional heart care, we have created designated Provider Care Teams.  These Care Teams include your primary Cardiologist (physician) and Advanced Practice Providers (APPs -  Physician Assistants and Nurse Practitioners) who all work together to provide you with the care you need, when you need it. You will need a follow up appointment in 6 months.  Please call our office 2 months in advance to schedule this appointment.  You may see Robert Krasowski, MD or another member of our [facility] Provider Team in [place]: Brian Munley, MD . Rajan Revankar, MD  Any Other Special Instructions Will Be Listed Below (If Applicable).     

## 2019-05-10 NOTE — Progress Notes (Signed)
Virtual Visit via Telephone Note   This visit type was conducted due to national recommendations for restrictions regarding the COVID-19 Pandemic (e.g. social distancing) in an effort to limit this patient's exposure and mitigate transmission in our community.  Due to her co-morbid illnesses, this patient is at least at moderate risk for complications without adequate follow up.  This format is felt to be most appropriate for this patient at this time.  The patient did not have access to video technology/had technical difficulties with video requiring transitioning to audio format only (telephone).  All issues noted in this document were discussed and addressed.  No physical exam could be performed with this format.  Please refer to the patient's chart for her  consent to telehealth for [facility].  Evaluation Performed:  Follow-up visit  This visit type was conducted due to national recommendations for restrictions regarding the COVID-19 Pandemic (e.g. social distancing).  This format is felt to be most appropriate for this patient at this time.  All issues noted in this document were discussed and addressed.  No physical exam was performed (except for noted visual exam findings with Video Visits).  Please refer to the patient's chart (MyChart message for video visits and phone note for telephone visits) for the patient's consent to telehealth for [facility].  Date:  05/10/2019  ID: Erika Hamilton, DOB 05-15-1933, MRN [id number]   Patient Location: [address] [place] [address]   Provider location:   [place] Office  PCP:  Erika Kiel, MD  Cardiologist:  Erika Campus, MD     Chief Complaint: Doing well   History of Present Illness:    Erika Hamilton is a 85 y.o. female  who presents via audio/video conferencing for a telehealth visit today.  Past medical history significant for coronary artery disease, status post completely occluded distal left  anterior descending artery in 2015, did not require any intervention, essential hypertension, diabetes.  Overall she is doing well denies have any chest pain tightness squeezing pressure burning chest but because of coronavirus situation she is trying to stay home.  She is still grieving about his son passing he died in accident.  That happened last year.  He she actually started crying talking to me over the phone.   The patient does not have symptoms concerning for COVID-19 infection (fever, chills, cough, or new SHORTNESS OF BREATH).    Prior CV studies:   The following studies were reviewed today:  Echocardiogram done recently showed normal left ventricular ejection fraction, no segmental wall motion abnormality, trace TR     Past Medical History:  Diagnosis Date  . Cancer (HCC)    breast, tongue, skin cancer  . Diabetes mellitus without complication ([place])   . GERD (gastroesophageal reflux disease)   . Headache(784.0)   . Heart murmur   . Hx of repair of rotator cuff   . Hypertension   . MI (myocardial infarction) ([place])   . Pancreatitis     Past Surgical History:  Procedure Laterality Date  . BREAST SURGERY Left    lumpectomy  . CHOLECYSTECTOMY N/A 07/09/2014   Procedure: LAPAROSCOPIC CHOLECYSTECTOMY WITH INTRAOPERATIVE CHOLANGIOGRAM;  Surgeon: Shann Medal, MD;  Location: [facility];  Service: General;  Laterality: N/A;  . CORONARY ANGIOPLASTY    . HERNIA REPAIR     umbilical  . SHOULDER SURGERY    . TONGUE SURGERY     removal of cancerous mass  . TONSILLECTOMY  Current Meds  Medication Sig  . aspirin EC 81 MG tablet Take 81 mg by mouth daily.  Marland Kitchen atorvastatin (LIPITOR) 40 MG tablet Take 40 mg by mouth daily.  . carvedilol (COREG) 12.5 MG tablet Take 25 mg by mouth 2 (two) times daily with a meal.   . insulin degludec (TRESIBA FLEXTOUCH) 100 UNIT/ML SOPN FlexTouch Pen Inject 60 Units into the skin daily.  . Melatonin 1 MG CAPS Take by mouth.  . nitroGLYCERIN  (NITROSTAT) 0.4 MG SL tablet Place 1 tablet (0.4 mg total) under the tongue every 5 (five) minutes as needed for chest pain.  . pantoprazole (PROTONIX) 20 MG tablet Take 20 mg by mouth daily.   . quinapril (ACCUPRIL) 40 MG tablet Take 40 mg by mouth daily.   Marland Kitchen Specialty Vitamins Products (MAGNESIUM, AMINO ACID CHELATE,) 133 MG tablet Take 1 tablet by mouth daily.      Family History: The patient's family history includes Coronary artery disease in her father; Diabetes in her maternal aunt, maternal uncle, and mother.   ROS:   Please see the history of present illness.     All other systems reviewed and are negative.   Labs/Other Tests and Data Reviewed:     Recent Labs: No results found for requested labs within last 8760 hours.  Recent Lipid Panel No results found for: CHOL, TRIG, HDL, CHOLHDL, VLDL, LDLCALC, LDLDIRECT    Exam:    Vital Signs:  BP (!) 146/53   Pulse 67   Wt 179 lb (81.2 kg)   BMI 34.96 kg/m     Wt Readings from Last 3 Encounters:  05/10/19 179 lb (81.2 kg)  10/11/18 180 lb 6.4 oz (81.8 kg)  08/08/18 180 lb (81.6 kg)     Well nourished, well developed in no acute distress. Alert awake and at the time 3 talking to me over the phone.  Unable to establish video link.  Asymptomatic at the time of my interview initially happy but then when she started talking about her son she started crying.  Diagnosis for this visit:   1. Coronary artery disease involving native coronary artery of native heart with angina pectoris ([place])   2. Essential hypertension   3. Dyslipidemia   4. Hepatic steatosis      ASSESSMENT & PLAN:    1.  Coronary disease stable on appropriate medications which I will continue.  Last echocardiogram showed preserved ejection fraction, no evidence of segmental wall motion maladies. 2.  Essential hypertension blood pressure well controlled continue present management. 3.  Dyslipidemia on high intensity statin which I will continue will  contact primary care physician to get her fasting lipid profile. 4.  Hepatic steatosis asymptomatic.  COVID-19 Education: The signs and symptoms of COVID-19 were discussed with the patient and how to seek care for testing (follow up with PCP or arrange E-visit).  The importance of social distancing was discussed today.  Patient Risk:   After full review of this patients clinical status, I feel that they are at least moderate risk at this time.  Time:   Today, I have spent 18 minutes with the patient with telehealth technology discussing pt health issues.  I spent 5 minutes reviewing her chart before the visit.  Visit was finished at 8:59am.    Medication Adjustments/Labs and Tests Ordered: Current medicines are reviewed at length with the patient today.  Concerns regarding medicines are outlined above.  No orders of the defined types were placed in this encounter.  Medication changes: No orders of the defined types were placed in this encounter.    Disposition: Follow-up in 6 months  Signed, Park Liter, MD, [facility] 05/10/2019 8:59 AM    [place]

## 2019-05-29 DIAGNOSIS — Z6835 Body mass index (BMI) 35.0-35.9, adult: Secondary | ICD-10-CM | POA: Diagnosis not present

## 2019-05-29 DIAGNOSIS — M79671 Pain in right foot: Secondary | ICD-10-CM | POA: Diagnosis not present

## 2019-05-29 DIAGNOSIS — E669 Obesity, unspecified: Secondary | ICD-10-CM | POA: Diagnosis not present

## 2019-06-05 ENCOUNTER — Other Ambulatory Visit: Payer: Self-pay

## 2019-06-05 ENCOUNTER — Other Ambulatory Visit: Payer: Self-pay | Admitting: Podiatry

## 2019-06-05 ENCOUNTER — Ambulatory Visit (INDEPENDENT_AMBULATORY_CARE_PROVIDER_SITE_OTHER): Payer: PPO | Admitting: Podiatry

## 2019-06-05 ENCOUNTER — Encounter: Payer: Self-pay | Admitting: Podiatry

## 2019-06-05 ENCOUNTER — Ambulatory Visit (INDEPENDENT_AMBULATORY_CARE_PROVIDER_SITE_OTHER): Payer: PPO

## 2019-06-05 VITALS — Temp 98.1°F | Resp 16

## 2019-06-05 DIAGNOSIS — E1151 Type 2 diabetes mellitus with diabetic peripheral angiopathy without gangrene: Secondary | ICD-10-CM

## 2019-06-05 DIAGNOSIS — B351 Tinea unguium: Secondary | ICD-10-CM | POA: Diagnosis not present

## 2019-06-05 DIAGNOSIS — M79671 Pain in right foot: Secondary | ICD-10-CM

## 2019-06-05 DIAGNOSIS — E1169 Type 2 diabetes mellitus with other specified complication: Secondary | ICD-10-CM | POA: Diagnosis not present

## 2019-06-05 DIAGNOSIS — M205X1 Other deformities of toe(s) (acquired), right foot: Secondary | ICD-10-CM

## 2019-06-05 DIAGNOSIS — M2041 Other hammer toe(s) (acquired), right foot: Secondary | ICD-10-CM | POA: Diagnosis not present

## 2019-06-05 DIAGNOSIS — S9031XA Contusion of right foot, initial encounter: Secondary | ICD-10-CM

## 2019-06-05 DIAGNOSIS — L84 Corns and callosities: Secondary | ICD-10-CM

## 2019-06-05 NOTE — Progress Notes (Signed)
Subjective:  Patient ID: Erika Hamilton, female    DOB: February 11, 1933,  MRN: [id number]  Chief Complaint  Patient presents with  . Foot Pain    Rt dorsal foot pain radiates to ankle x 3 wks (dropped a small container on foot twice); 83/10 shapr pains -pt denies redness/swelling Tx: epsoms atl soaking -worse with walking    85 y.o. female presents with the above complaint. Hx as above. Also has DM requesting care of her nails and callus. Complains of painful right 2nd hammertoe which overlaps her big toe.  Review of Systems: Negative except as noted in the HPI. Denies N/V/F/Ch.  Past Medical History:  Diagnosis Date  . Cancer (HCC)    breast, tongue, skin cancer  . Diabetes mellitus without complication ([place])   . GERD (gastroesophageal reflux disease)   . Headache(784.0)   . Heart murmur   . Hx of repair of rotator cuff   . Hypertension   . MI (myocardial infarction) ([place])   . Pancreatitis     Current Outpatient Medications:  .  aspirin EC 81 MG tablet, Take 81 mg by mouth daily., Disp: , Rfl:  .  atorvastatin (LIPITOR) 40 MG tablet, Take 40 mg by mouth daily., Disp: , Rfl:  .  carvedilol (COREG) 12.5 MG tablet, Take 25 mg by mouth 2 (two) times daily with a meal. , Disp: , Rfl:  .  insulin degludec (TRESIBA FLEXTOUCH) 100 UNIT/ML SOPN FlexTouch Pen, Inject 60 Units into the skin daily., Disp: , Rfl:  .  Melatonin 1 MG CAPS, Take by mouth., Disp: , Rfl:  .  nitroGLYCERIN (NITROSTAT) 0.4 MG SL tablet, Place 1 tablet (0.4 mg total) under the tongue every 5 (five) minutes as needed for chest pain., Disp: 25 tablet, Rfl: 9 .  pantoprazole (PROTONIX) 20 MG tablet, Take 20 mg by mouth daily. , Disp: , Rfl:  .  quinapril (ACCUPRIL) 40 MG tablet, Take 40 mg by mouth daily. , Disp: , Rfl:  .  Specialty Vitamins Products (MAGNESIUM, AMINO ACID CHELATE,) 133 MG tablet, Take 1 tablet by mouth daily., Disp: , Rfl:   Social History   Tobacco Use  Smoking Status Never Smoker  Smokeless  Tobacco Never Used    Allergies  Allergen Reactions  . Atorvastatin   . Metformin Hcl    Objective:   Vitals:   06/05/19 0915  Resp: 16  Temp: 98.1 F (36.7 C)   There is no height or weight on file to calculate BMI. Constitutional Well developed. Well nourished.  Vascular Dorsalis pedis pulses palpable bilaterally. Posterior tibial pulses non-palpable bilaterally. Capillary refill normal to all digits.  No cyanosis or clubbing noted. Pedal hair growth normal.  Neurologic Normal speech. Oriented to person, place, and time. Epicritic sensation to light touch grossly present bilaterally.  Dermatologic Nails well groomed and normal in appearance. No open wounds. HPK left 3rd PIPJ laterally.  Orthopedic: POP right 2nd, 3rd metatarsal shafts Hammertoes bilat with right 2nd toe crossover deformity medially.   Radiographs: Taken and reviewed no acute fractures but cortical irregularity 2nd metatarsal noted. Assessment:   1. Contusion of right foot, initial encounter   2. Onychomycosis of multiple toenails with type 2 diabetes mellitus and peripheral angiopathy ([place])   3. Hammertoe of right foot   4. Corns and callosities   5. Diabetes mellitus type 2 with peripheral artery disease ([place])    Plan:  Patient was evaluated and treated and all questions answered.  Bone contusion right -XR reviewed  no definite fractures -Surgical shoe dispensed right. Medically necessary for safe offloading. -Educated on rest, icing, elevation.  DM with PAD -Nails debrided x10 -Callus pared x1  Procedure: Nail Debridement Rationale: Patient meets criteria for routine foot care due to PAD Type of Debridement: manual, sharp debridement. Instrumentation: Nail nipper, rotary burr. Number of Nails: 10  Procedure: Paring of Lesion Rationale: painful hyperkeratotic lesion Type of Debridement: manual, sharp debridement. Instrumentation: 312 blade Number of Lesions: 1   Hammertoe Right 2nd  Toe -Dispense crest pad  Return in about 1 month (around 07/06/2019).

## 2019-06-13 ENCOUNTER — Other Ambulatory Visit: Payer: Self-pay | Admitting: Podiatry

## 2019-06-13 DIAGNOSIS — S9031XA Contusion of right foot, initial encounter: Secondary | ICD-10-CM

## 2019-06-13 DIAGNOSIS — M79671 Pain in right foot: Secondary | ICD-10-CM

## 2019-07-04 ENCOUNTER — Other Ambulatory Visit: Payer: Self-pay

## 2019-07-04 ENCOUNTER — Other Ambulatory Visit: Payer: Self-pay | Admitting: Podiatry

## 2019-07-04 ENCOUNTER — Encounter: Payer: Self-pay | Admitting: Podiatry

## 2019-07-04 ENCOUNTER — Ambulatory Visit (INDEPENDENT_AMBULATORY_CARE_PROVIDER_SITE_OTHER): Payer: PPO

## 2019-07-04 ENCOUNTER — Ambulatory Visit (INDEPENDENT_AMBULATORY_CARE_PROVIDER_SITE_OTHER): Payer: PPO | Admitting: Podiatry

## 2019-07-04 VITALS — Temp 97.0°F | Resp 16

## 2019-07-04 DIAGNOSIS — S9031XD Contusion of right foot, subsequent encounter: Secondary | ICD-10-CM

## 2019-07-04 NOTE — Progress Notes (Signed)
Subjective:  Patient ID: Erika Hamilton, female    DOB: 1932/12/10,  MRN: [id number]  Chief Complaint  Patient presents with  . foot constusion    F/U Rt constusion Pt.s tates," the shoe it burns all overy my foot, my foot still ahces. The she  keeps me off balance; 8/10 pain." -pt states her sx shoe is making her foot hurt more Tx: swx shoe, cane, elevation -pt denie sswelling    85 y.o. female presents with the above complaint. Hx as above.   Review of Systems: Negative except as noted in the HPI. Denies N/V/F/Ch.  Past Medical History:  Diagnosis Date  . Cancer (HCC)    breast, tongue, skin cancer  . Diabetes mellitus without complication ([place])   . GERD (gastroesophageal reflux disease)   . Headache(784.0)   . Heart murmur   . Hx of repair of rotator cuff   . Hypertension   . MI (myocardial infarction) ([place])   . Pancreatitis     Current Outpatient Medications:  .  aspirin EC 81 MG tablet, Take 81 mg by mouth daily., Disp: , Rfl:  .  atorvastatin (LIPITOR) 40 MG tablet, Take 40 mg by mouth daily., Disp: , Rfl:  .  BD PEN NEEDLE NANO U/F 32G X 4 MM MISC, USE TO INJECT INSULIN ONCE DAILY, Disp: , Rfl:  .  carvedilol (COREG) 12.5 MG tablet, Take 25 mg by mouth 2 (two) times daily with a meal. , Disp: , Rfl:  .  insulin degludec (TRESIBA FLEXTOUCH) 100 UNIT/ML SOPN FlexTouch Pen, Inject 60 Units into the skin daily., Disp: , Rfl:  .  Melatonin 1 MG CAPS, Take by mouth., Disp: , Rfl:  .  nitroGLYCERIN (NITROSTAT) 0.4 MG SL tablet, Place 1 tablet (0.4 mg total) under the tongue every 5 (five) minutes as needed for chest pain., Disp: 25 tablet, Rfl: 9 .  pantoprazole (PROTONIX) 20 MG tablet, Take 20 mg by mouth daily. , Disp: , Rfl:  .  quinapril (ACCUPRIL) 40 MG tablet, Take 40 mg by mouth daily. , Disp: , Rfl:  .  Specialty Vitamins Products (MAGNESIUM, AMINO ACID CHELATE,) 133 MG tablet, Take 1 tablet by mouth daily., Disp: , Rfl:   Social History   Tobacco Use  Smoking  Status Never Smoker  Smokeless Tobacco Never Used    Allergies  Allergen Reactions  . Atorvastatin   . Metformin Hcl    Objective:   Vitals:   07/04/19 1000  Resp: 16  Temp: (!) 97 F (36.1 C)   There is no height or weight on file to calculate BMI. Constitutional Well developed. Well nourished.  Vascular Dorsalis pedis pulses palpable bilaterally. Posterior tibial pulses non-palpable bilaterally. Capillary refill normal to all digits.  No cyanosis or clubbing noted. Pedal hair growth normal.  Neurologic Normal speech. Oriented to person, place, and time. Epicritic sensation to light touch grossly present bilaterally.  Dermatologic Nails well groomed and normal in appearance. No open wounds. HPK left 3rd PIPJ laterally.  Orthopedic: POP right 2nd, 3rd metatarsal shafts Hammertoes bilat with right 2nd toe crossover deformity medially.   Radiographs: Repeat XR without interval evidence of fracture Assessment:   1. Contusion of right foot, subsequent encounter    Plan:  Patient was evaluated and treated and all questions answered.  Bone contusion right -Repeat XR without fracture -Can d/c surgical shoe as it is uncomfortable. -Advised pain will likely subside with time, but to call should it not.  Venous insufficiency -Recommend compression  socks. Given advice on where to purchase them.  No follow-ups on file.

## 2019-07-20 ENCOUNTER — Telehealth: Payer: Self-pay | Admitting: Cardiology

## 2019-07-20 NOTE — Telephone Encounter (Signed)
Attempted to call patient back. No answer will continue efforts.  

## 2019-07-20 NOTE — Telephone Encounter (Signed)
Wants to know If she can take CBD oil

## 2019-07-21 NOTE — Telephone Encounter (Signed)
Patient's son bough her Cannbid oil (CBD oil). She said it's 4 drops oral daily. She wants to make sure she is okay to take this. Please advise.

## 2019-07-24 NOTE — Telephone Encounter (Signed)
Patient informed of Dr. Wendy Poet recommendation. She verbally understood. No further questions.

## 2019-07-24 NOTE — Telephone Encounter (Signed)
No data, she takes it on her own risk

## 2019-09-05 ENCOUNTER — Ambulatory Visit: Payer: PPO | Admitting: Podiatry

## 2019-09-06 DIAGNOSIS — M7741 Metatarsalgia, right foot: Secondary | ICD-10-CM | POA: Diagnosis not present

## 2019-09-06 DIAGNOSIS — L603 Nail dystrophy: Secondary | ICD-10-CM | POA: Diagnosis not present

## 2019-09-06 DIAGNOSIS — E1165 Type 2 diabetes mellitus with hyperglycemia: Secondary | ICD-10-CM | POA: Diagnosis not present

## 2019-09-06 DIAGNOSIS — E1142 Type 2 diabetes mellitus with diabetic polyneuropathy: Secondary | ICD-10-CM | POA: Diagnosis not present

## 2019-09-06 DIAGNOSIS — L84 Corns and callosities: Secondary | ICD-10-CM | POA: Diagnosis not present

## 2019-09-06 DIAGNOSIS — Z794 Long term (current) use of insulin: Secondary | ICD-10-CM | POA: Diagnosis not present

## 2019-10-02 DIAGNOSIS — M25571 Pain in right ankle and joints of right foot: Secondary | ICD-10-CM | POA: Diagnosis not present

## 2019-10-02 DIAGNOSIS — E1165 Type 2 diabetes mellitus with hyperglycemia: Secondary | ICD-10-CM | POA: Diagnosis not present

## 2019-10-02 DIAGNOSIS — Z79899 Other long term (current) drug therapy: Secondary | ICD-10-CM | POA: Diagnosis not present

## 2019-10-02 DIAGNOSIS — Z0001 Encounter for general adult medical examination with abnormal findings: Secondary | ICD-10-CM | POA: Diagnosis not present

## 2019-10-02 DIAGNOSIS — E785 Hyperlipidemia, unspecified: Secondary | ICD-10-CM | POA: Diagnosis not present

## 2019-10-02 DIAGNOSIS — Z1339 Encounter for screening examination for other mental health and behavioral disorders: Secondary | ICD-10-CM | POA: Diagnosis not present

## 2019-10-02 DIAGNOSIS — Z1331 Encounter for screening for depression: Secondary | ICD-10-CM | POA: Diagnosis not present

## 2019-10-02 DIAGNOSIS — I10 Essential (primary) hypertension: Secondary | ICD-10-CM | POA: Diagnosis not present

## 2019-10-02 DIAGNOSIS — M25512 Pain in left shoulder: Secondary | ICD-10-CM | POA: Diagnosis not present

## 2019-10-02 DIAGNOSIS — I25119 Atherosclerotic heart disease of native coronary artery with unspecified angina pectoris: Secondary | ICD-10-CM | POA: Diagnosis not present

## 2019-10-06 DIAGNOSIS — E1165 Type 2 diabetes mellitus with hyperglycemia: Secondary | ICD-10-CM | POA: Diagnosis not present

## 2019-10-06 DIAGNOSIS — E782 Mixed hyperlipidemia: Secondary | ICD-10-CM | POA: Diagnosis not present

## 2019-10-06 DIAGNOSIS — K760 Fatty (change of) liver, not elsewhere classified: Secondary | ICD-10-CM | POA: Diagnosis not present

## 2019-10-06 DIAGNOSIS — I10 Essential (primary) hypertension: Secondary | ICD-10-CM | POA: Diagnosis not present

## 2019-10-18 ENCOUNTER — Other Ambulatory Visit: Payer: Self-pay

## 2019-10-24 ENCOUNTER — Ambulatory Visit: Payer: PPO | Admitting: Podiatry

## 2019-11-10 ENCOUNTER — Ambulatory Visit (INDEPENDENT_AMBULATORY_CARE_PROVIDER_SITE_OTHER): Payer: PPO | Admitting: Cardiology

## 2019-11-10 ENCOUNTER — Other Ambulatory Visit: Payer: Self-pay

## 2019-11-10 ENCOUNTER — Encounter: Payer: Self-pay | Admitting: Cardiology

## 2019-11-10 VITALS — BP 110/60 | HR 64 | Ht 60.0 in | Wt 177.8 lb

## 2019-11-10 DIAGNOSIS — E1165 Type 2 diabetes mellitus with hyperglycemia: Secondary | ICD-10-CM | POA: Diagnosis not present

## 2019-11-10 DIAGNOSIS — I25119 Atherosclerotic heart disease of native coronary artery with unspecified angina pectoris: Secondary | ICD-10-CM | POA: Diagnosis not present

## 2019-11-10 DIAGNOSIS — E785 Hyperlipidemia, unspecified: Secondary | ICD-10-CM | POA: Diagnosis not present

## 2019-11-10 DIAGNOSIS — I10 Essential (primary) hypertension: Secondary | ICD-10-CM

## 2019-11-10 NOTE — Progress Notes (Signed)
Cardiology Office Note:    Date:  11/10/2019   ID:  Erika Hamilton, DOB 29-Jul-1933, MRN [id number]  PCP:  Ernestene Kiel, MD  Cardiologist:  Jenne Campus, MD    Referring MD: Ernestene Kiel, MD   Chief Complaint  Patient presents with  . Follow-up  Cardiac wise  History of Present Illness:    Erika Hamilton is a 85 y.o. female with past medical history significant for coronary artery disease, she did have cardiac catheterization done in 2015 she was found to have completely occluded distal portion of left anterior descending artery.  No intervention has been needed.  Also history of hypertension dyslipidemia diabetes.  Comes today to my office cardiac wise seems to be doing well tragedy strike in her family her son-in-law who is 44 died suddenly because of massive stroke.  Also her son was killed by a drunk driver at this year so it is a very very difficult time for her on top of that coronavirus situation so obviously she is grieving after the death of her son-in-law but that she laughs.  But overall seems to be holding quite okay from cardiac standpoint of view.  Past Medical History:  Diagnosis Date  . Cancer (HCC)    breast, tongue, skin cancer  . Diabetes mellitus without complication ([place])   . GERD (gastroesophageal reflux disease)   . Headache(784.0)   . Heart murmur   . Hx of repair of rotator cuff   . Hypertension   . MI (myocardial infarction) ([place])   . Pancreatitis     Past Surgical History:  Procedure Laterality Date  . BREAST SURGERY Left    lumpectomy  . CHOLECYSTECTOMY N/A 07/09/2014   Procedure: LAPAROSCOPIC CHOLECYSTECTOMY WITH INTRAOPERATIVE CHOLANGIOGRAM;  Surgeon: Shann Medal, MD;  Location: [facility];  Service: General;  Laterality: N/A;  . CORONARY ANGIOPLASTY    . HERNIA REPAIR     umbilical  . SHOULDER SURGERY    . TONGUE SURGERY     removal of cancerous mass  . TONSILLECTOMY      Current Medications: Current Meds  Medication Sig   . aspirin EC 81 MG tablet Take 81 mg by mouth daily.  Marland Kitchen atorvastatin (LIPITOR) 40 MG tablet Take 40 mg by mouth daily.  . BD PEN NEEDLE NANO U/F 32G X 4 MM MISC USE TO INJECT INSULIN ONCE DAILY  . carvedilol (COREG) 12.5 MG tablet Take 25 mg by mouth 2 (two) times daily with a meal.   . ferrous sulfate 325 (65 FE) MG EC tablet Take 325 mg by mouth daily with breakfast.  . insulin degludec (TRESIBA FLEXTOUCH) 100 UNIT/ML SOPN FlexTouch Pen Inject 60 Units into the skin daily.  Marland Kitchen ketoconazole (NIZORAL) 2 % shampoo SHAMPOO HAIR; LATHER IN AND RINSE OUT TWICE A WEEK AS NEEDED FOR SCALP  . Melatonin 1 MG CAPS Take by mouth.  . nitroGLYCERIN (NITROSTAT) 0.4 MG SL tablet Place 1 tablet (0.4 mg total) under the tongue every 5 (five) minutes as needed for chest pain.  . pantoprazole (PROTONIX) 20 MG tablet Take 20 mg by mouth daily.   . quinapril (ACCUPRIL) 40 MG tablet Take 40 mg by mouth daily.   Marland Kitchen Specialty Vitamins Products (MAGNESIUM, AMINO ACID CHELATE,) 133 MG tablet Take 1 tablet by mouth daily.  . Vitamin D, Cholecalciferol, 50 MCG (2000 UT) CAPS Take 1 capsule by mouth daily.  . vitamin E 1000 UNIT capsule Take 1,000 Units by mouth daily.     Allergies:  Atorvastatin and Metformin hcl   Social History   Socioeconomic History  . Marital status: Married    Spouse name: Not on file  . Number of children: Not on file  . Years of education: Not on file  . Highest education level: Not on file  Occupational History  . Not on file  Tobacco Use  . Smoking status: Never Smoker  . Smokeless tobacco: Never Used  Substance and Sexual Activity  . Alcohol use: No  . Drug use: No  . Sexual activity: Not on file  Other Topics Concern  . Not on file  Social History Narrative  . Not on file   Social Determinants of Health   Financial Resource Strain:   . Difficulty of Paying Living Expenses: Not on file  Food Insecurity:   . Worried About Charity fundraiser in the Last Year: Not on file   . Ran Out of Food in the Last Year: Not on file  Transportation Needs:   . Lack of Transportation (Medical): Not on file  . Lack of Transportation (Non-Medical): Not on file  Physical Activity:   . Days of Exercise per Week: Not on file  . Minutes of Exercise per Session: Not on file  Stress:   . Feeling of Stress : Not on file  Social Connections:   . Frequency of Communication with Friends and Family: Not on file  . Frequency of Social Gatherings with Friends and Family: Not on file  . Attends Religious Services: Not on file  . Active Member of Clubs or Organizations: Not on file  . Attends Archivist Meetings: Not on file  . Marital Status: Not on file     Family History: The patient's family history includes Coronary artery disease in her father; Diabetes in her maternal aunt, maternal uncle, and mother. ROS:   Please see the history of present illness.    All 14 point review of systems negative except as described per history of present illness  EKGs/Labs/Other Studies Reviewed:      Recent Labs: No results found for requested labs within last 8760 hours.  Recent Lipid Panel No results found for: CHOL, TRIG, HDL, CHOLHDL, VLDL, LDLCALC, LDLDIRECT  Physical Exam:    VS:  BP 110/60   Pulse 64   Ht 5' (1.524 m)   Wt 177 lb 12.8 oz (80.6 kg)   SpO2 97%   BMI 34.72 kg/m     Wt Readings from Last 3 Encounters:  11/10/19 177 lb 12.8 oz (80.6 kg)  05/10/19 179 lb (81.2 kg)  10/11/18 180 lb 6.4 oz (81.8 kg)     GEN:  Well nourished, well developed in no acute distress HEENT: Normal NECK: No JVD; No carotid bruits LYMPHATICS: No lymphadenopathy CARDIAC: RRR, no murmurs, no rubs, no gallops RESPIRATORY:  Clear to auscultation without rales, wheezing or rhonchi  ABDOMEN: Soft, non-tender, non-distended MUSCULOSKELETAL:  No edema; No deformity  SKIN: Warm and dry LOWER EXTREMITIES: no swelling NEUROLOGIC:  Alert and oriented x 3 PSYCHIATRIC:  Normal  affect   ASSESSMENT:    1. Coronary artery disease involving native coronary artery of native heart with angina pectoris ([place])   2. Essential hypertension   3. Type 2 diabetes mellitus with hyperglycemia, without long-term current use of insulin ([place])   4. Dyslipidemia    PLAN:    In order of problems listed above:  1. Coronary disease stable from that point review I will continue present management. 2. Essential  hypertension blood pressure well controlled continue present management 3. Type 2 diabetes stable followed by internal medicine team. 4. Dyslipidemia I will call primary care physician who does have a cholesterol profile.   Medication Adjustments/Labs and Tests Ordered: Current medicines are reviewed at length with the patient today.  Concerns regarding medicines are outlined above.  No orders of the defined types were placed in this encounter.  Medication changes: No orders of the defined types were placed in this encounter.   Signed, Park Liter, MD, [facility] 11/10/2019 10:33 AM    [place]

## 2019-11-10 NOTE — Patient Instructions (Signed)

## 2019-11-16 DIAGNOSIS — E119 Type 2 diabetes mellitus without complications: Secondary | ICD-10-CM | POA: Diagnosis not present

## 2019-12-06 DIAGNOSIS — I10 Essential (primary) hypertension: Secondary | ICD-10-CM | POA: Diagnosis not present

## 2019-12-06 DIAGNOSIS — K760 Fatty (change of) liver, not elsewhere classified: Secondary | ICD-10-CM | POA: Diagnosis not present

## 2019-12-06 DIAGNOSIS — Z6837 Body mass index (BMI) 37.0-37.9, adult: Secondary | ICD-10-CM | POA: Diagnosis not present

## 2019-12-06 DIAGNOSIS — E1165 Type 2 diabetes mellitus with hyperglycemia: Secondary | ICD-10-CM | POA: Diagnosis not present

## 2020-01-03 DIAGNOSIS — Z961 Presence of intraocular lens: Secondary | ICD-10-CM | POA: Diagnosis not present

## 2020-01-03 DIAGNOSIS — H1820 Unspecified corneal edema: Secondary | ICD-10-CM | POA: Diagnosis not present

## 2020-01-03 DIAGNOSIS — E119 Type 2 diabetes mellitus without complications: Secondary | ICD-10-CM | POA: Diagnosis not present

## 2020-01-03 DIAGNOSIS — H18523 Epithelial (juvenile) corneal dystrophy, bilateral: Secondary | ICD-10-CM | POA: Diagnosis not present

## 2020-02-06 DIAGNOSIS — H18523 Epithelial (juvenile) corneal dystrophy, bilateral: Secondary | ICD-10-CM | POA: Diagnosis not present

## 2020-02-06 DIAGNOSIS — H18511 Endothelial corneal dystrophy, right eye: Secondary | ICD-10-CM | POA: Diagnosis not present

## 2020-02-12 DIAGNOSIS — Z1231 Encounter for screening mammogram for malignant neoplasm of breast: Secondary | ICD-10-CM | POA: Diagnosis not present

## 2020-02-16 ENCOUNTER — Other Ambulatory Visit: Payer: Self-pay | Admitting: Cardiology

## 2020-02-16 NOTE — Telephone Encounter (Signed)
Refilled patient's nitro.

## 2020-02-16 NOTE — Telephone Encounter (Signed)
*  STAT* If patient is at the pharmacy, call can be transferred to refill team.   1. Which medications need to be refilled? (please list name of each medication and dose if known)  New prescription for  Nitroglycerin  2. Which pharmacy/location (including street and city if local pharmacy) is medication to be sent to?* [facility] [place]  3. Do they need a 30 day or 90 day supply? 1 bottle

## 2020-03-06 DIAGNOSIS — Z853 Personal history of malignant neoplasm of breast: Secondary | ICD-10-CM | POA: Diagnosis not present

## 2020-03-06 DIAGNOSIS — Z85818 Personal history of malignant neoplasm of other sites of lip, oral cavity, and pharynx: Secondary | ICD-10-CM | POA: Diagnosis not present

## 2020-03-06 DIAGNOSIS — M8580 Other specified disorders of bone density and structure, unspecified site: Secondary | ICD-10-CM | POA: Diagnosis not present

## 2020-04-02 DIAGNOSIS — I10 Essential (primary) hypertension: Secondary | ICD-10-CM | POA: Diagnosis not present

## 2020-04-02 DIAGNOSIS — E1165 Type 2 diabetes mellitus with hyperglycemia: Secondary | ICD-10-CM | POA: Diagnosis not present

## 2020-04-02 DIAGNOSIS — E785 Hyperlipidemia, unspecified: Secondary | ICD-10-CM | POA: Diagnosis not present

## 2020-04-02 DIAGNOSIS — H9313 Tinnitus, bilateral: Secondary | ICD-10-CM | POA: Diagnosis not present

## 2020-04-02 DIAGNOSIS — Z1331 Encounter for screening for depression: Secondary | ICD-10-CM | POA: Diagnosis not present

## 2020-04-02 DIAGNOSIS — Z6834 Body mass index (BMI) 34.0-34.9, adult: Secondary | ICD-10-CM | POA: Diagnosis not present

## 2020-04-02 DIAGNOSIS — K219 Gastro-esophageal reflux disease without esophagitis: Secondary | ICD-10-CM | POA: Diagnosis not present

## 2020-04-02 DIAGNOSIS — I25119 Atherosclerotic heart disease of native coronary artery with unspecified angina pectoris: Secondary | ICD-10-CM | POA: Diagnosis not present

## 2020-04-02 DIAGNOSIS — Z7189 Other specified counseling: Secondary | ICD-10-CM | POA: Diagnosis not present

## 2020-04-08 DIAGNOSIS — K760 Fatty (change of) liver, not elsewhere classified: Secondary | ICD-10-CM | POA: Diagnosis not present

## 2020-04-08 DIAGNOSIS — Z6837 Body mass index (BMI) 37.0-37.9, adult: Secondary | ICD-10-CM | POA: Diagnosis not present

## 2020-04-08 DIAGNOSIS — I10 Essential (primary) hypertension: Secondary | ICD-10-CM | POA: Diagnosis not present

## 2020-04-08 DIAGNOSIS — E1165 Type 2 diabetes mellitus with hyperglycemia: Secondary | ICD-10-CM | POA: Diagnosis not present

## 2020-04-15 ENCOUNTER — Telehealth: Payer: Self-pay | Admitting: Cardiology

## 2020-04-15 NOTE — Telephone Encounter (Signed)
Erika Hamilton is calling wanting to know if him and his wife will be needing fasting labs at their upcoming appointment scheduled for 05/23/20 at 9:20 AM. He states if so please give him a call and see if an earlier appointment time is available due to his wife being diabetic. Erika Hamilton states the best time to call in regards to this would be after lunch today or anytime tomorrow. He is also requesting if you are unable to get a hold of him please do not leave a VM he would rather discuss this over the phone then listen to a message and callback. Please advise.

## 2020-04-15 NOTE — Telephone Encounter (Signed)
Do you want her to be fasting at next appointment?

## 2020-04-16 NOTE — Telephone Encounter (Signed)
Called and spoke to patient informed her that she does not need to be fasting for her appointment she verbally understood no further questions.

## 2020-05-17 ENCOUNTER — Other Ambulatory Visit: Payer: Self-pay

## 2020-05-23 ENCOUNTER — Encounter: Payer: Self-pay | Admitting: Cardiology

## 2020-05-23 ENCOUNTER — Ambulatory Visit: Payer: PPO | Admitting: Cardiology

## 2020-05-23 ENCOUNTER — Other Ambulatory Visit: Payer: Self-pay

## 2020-05-23 VITALS — BP 126/80 | HR 83 | Ht 60.0 in | Wt 174.4 lb

## 2020-05-23 DIAGNOSIS — E785 Hyperlipidemia, unspecified: Secondary | ICD-10-CM | POA: Diagnosis not present

## 2020-05-23 DIAGNOSIS — I25119 Atherosclerotic heart disease of native coronary artery with unspecified angina pectoris: Secondary | ICD-10-CM | POA: Diagnosis not present

## 2020-05-23 DIAGNOSIS — I10 Essential (primary) hypertension: Secondary | ICD-10-CM | POA: Diagnosis not present

## 2020-05-23 DIAGNOSIS — E1165 Type 2 diabetes mellitus with hyperglycemia: Secondary | ICD-10-CM | POA: Diagnosis not present

## 2020-05-23 NOTE — Patient Instructions (Signed)
Medication Instructions:  °Your physician recommends that you continue on your current medications as directed. Please refer to the Current Medication list given to you today. ° °*If you need a refill on your cardiac medications before your next appointment, please call your pharmacy* ° ° °Lab Work: °None. ° °If you have labs (blood work) drawn today and your tests are completely normal, you will receive your results only by: °• MyChart Message (if you have MyChart) OR °• A paper copy in the mail °If you have any lab test that is abnormal or we need to change your treatment, we will call you to review the results. ° ° °Testing/Procedures: °Your physician has requested that you have an echocardiogram. Echocardiography is a painless test that uses sound waves to create images of your heart. It provides your doctor with information about the size and shape of your heart and how well your heart’s chambers and valves are working. This procedure takes approximately one hour. There are no restrictions for this procedure. ° ° ° ° °Follow-Up: °At [facility], you and your health needs are our priority.  As part of our continuing mission to provide you with exceptional heart care, we have created designated Provider Care Teams.  These Care Teams include your primary Cardiologist (physician) and Advanced Practice Providers (APPs -  Physician Assistants and Nurse Practitioners) who all work together to provide you with the care you need, when you need it. ° °We recommend signing up for the patient portal called "MyChart".  Sign up information is provided on this After Visit Summary.  MyChart is used to connect with patients for Virtual Visits (Telemedicine).  Patients are able to view lab/test results, encounter notes, upcoming appointments, etc.  Non-urgent messages can be sent to your provider as well.   °To learn more about what you can do with MyChart, go to [email or web address].   ° °Your next appointment:   °6  month(s) ° °The format for your next appointment:   °In Person ° °Provider:   °Robert Krasowski, MD ° ° °Other Instructions ° ° °Echocardiogram °An echocardiogram is a procedure that uses painless sound waves (ultrasound) to produce an image of the heart. Images from an echocardiogram can provide important information about: °· Signs of coronary artery disease (CAD). °· Aneurysm detection. An aneurysm is a weak or damaged part of an artery wall that bulges out from the normal force of blood pumping through the body. °· Heart size and shape. Changes in the size or shape of the heart can be associated with certain conditions, including heart failure, aneurysm, and CAD. °· Heart muscle function. °· Heart valve function. °· Signs of a past heart attack. °· Fluid buildup around the heart. °· Thickening of the heart muscle. °· A tumor or infectious growth around the heart valves. °Tell a health care provider about: °· Any allergies you have. °· All medicines you are taking, including vitamins, herbs, eye drops, creams, and over-the-counter medicines. °· Any blood disorders you have. °· Any surgeries you have had. °· Any medical conditions you have. °· Whether you are pregnant or may be pregnant. °What are the risks? °Generally, this is a safe procedure. However, problems may occur, including: °· Allergic reaction to dye (contrast) that may be used during the procedure. °What happens before the procedure? °No specific preparation is needed. You may eat and drink normally. °What happens during the procedure? ° °· An IV tube may be inserted into one of your veins. °· You may   receive contrast through this tube. A contrast is an injection that improves the quality of the pictures from your heart. °· A gel will be applied to your chest. °· A wand-like tool (transducer) will be moved over your chest. The gel will help to transmit the sound waves from the transducer. °· The sound waves will harmlessly bounce off of your heart to  allow the heart images to be captured in real-time motion. The images will be recorded on a computer. °The procedure may vary among health care providers and hospitals. °What happens after the procedure? °· You may return to your normal, everyday life, including diet, activities, and medicines, unless your health care provider tells you not to do that. °Summary °· An echocardiogram is a procedure that uses painless sound waves (ultrasound) to produce an image of the heart. °· Images from an echocardiogram can provide important information about the size and shape of your heart, heart muscle function, heart valve function, and fluid buildup around your heart. °· You do not need to do anything to prepare before this procedure. You may eat and drink normally. °· After the echocardiogram is completed, you may return to your normal, everyday life, unless your health care provider tells you not to do that. °This information is not intended to replace advice given to you by your health care provider. Make sure you discuss any questions you have with your health care provider. °Document Revised: 03/02/2019 Document Reviewed: 12/12/2016 °Elsevier Patient Education © 2020 [employer] ° ° °

## 2020-05-23 NOTE — Progress Notes (Signed)
Cardiology Office Note:    Date:  05/23/2020   ID:  Jones Broom, DOB [birth year], MRN [id number]  PCP:  Ernestene Kiel, MD  Cardiologist:  Jenne Campus, MD    Referring MD: Ernestene Kiel, MD   No chief complaint on file. I am doing fine  History of Present Illness:    Erika Hamilton is a 86 y.o. female  with past medical history significant for coronary artery disease, she did have cardiac catheterization done in 2015 she was found to have completely occluded distal portion of left anterior descending artery.  No intervention has been needed.  Also history of hypertension dyslipidemia diabetes She comes today to my office for follow-up.  Overall she seems to be doing well.  Denies have any chest pain tightness squeezing pressure burning chest.  She does have heartburn that typically happen after she eats chicken.  It does not happen with exercise.  Denies having any swelling of lower extremities.  Past Medical History:  Diagnosis Date  . Cancer (HCC)    breast, tongue, skin cancer  . Diabetes mellitus without complication ([place])   . GERD (gastroesophageal reflux disease)   . Headache(784.0)   . Heart murmur   . Hx of repair of rotator cuff   . Hypertension   . MI (myocardial infarction) ([place])   . Pancreatitis     Past Surgical History:  Procedure Laterality Date  . BREAST SURGERY Left    lumpectomy  . CHOLECYSTECTOMY N/A 07/09/2014   Procedure: LAPAROSCOPIC CHOLECYSTECTOMY WITH INTRAOPERATIVE CHOLANGIOGRAM;  Surgeon: Shann Medal, MD;  Location: [facility];  Service: General;  Laterality: N/A;  . CORONARY ANGIOPLASTY    . HERNIA REPAIR     umbilical  . SHOULDER SURGERY    . TONGUE SURGERY     removal of cancerous mass  . TONSILLECTOMY      Current Medications: Current Meds  Medication Sig  . aspirin EC 81 MG tablet Take 81 mg by mouth daily.  Marland Kitchen atorvastatin (LIPITOR) 40 MG tablet Take 40 mg by mouth daily.  . BD PEN NEEDLE NANO U/F  32G X 4 MM MISC USE TO INJECT INSULIN ONCE DAILY  . carvedilol (COREG) 12.5 MG tablet Take 25 mg by mouth 2 (two) times daily with a meal.   . Continuous Blood Gluc Sensor (FREESTYLE LIBRE 2 SENSOR) MISC   . ferrous sulfate 325 (65 FE) MG EC tablet Take 325 mg by mouth daily with breakfast.  . insulin degludec (TRESIBA FLEXTOUCH) 100 UNIT/ML SOPN FlexTouch Pen Inject 60 Units into the skin daily.  Marland Kitchen ketoconazole (NIZORAL) 2 % shampoo SHAMPOO HAIR; LATHER IN AND RINSE OUT TWICE A WEEK AS NEEDED FOR SCALP  . Lancets (ONETOUCH DELICA PLUS [id number]) MISC USE TO CHECK BLOOD SUGAR ONCE DAILY  . liver oil-zinc oxide (DESITIN) 40 % ointment Apply topically.  . Melatonin 1 MG CAPS Take by mouth.  . nitroGLYCERIN (NITROSTAT) 0.4 MG SL tablet DISSOLVE ONE TABLET UNDER THE TONGUE EVERY 5 MINUTES AS NEEDED FOR CHEST PAIN.  Marland Kitchen ONETOUCH VERIO test strip 1 each daily.  . pantoprazole (PROTONIX) 20 MG tablet Take 20 mg by mouth daily.   . quinapril (ACCUPRIL) 40 MG tablet Take 40 mg by mouth daily.   Marland Kitchen Specialty Vitamins Products (MAGNESIUM, AMINO ACID CHELATE,) 133 MG tablet Take 1 tablet by mouth daily.  . Vitamin D, Cholecalciferol, 50 MCG (2000 UT) CAPS Take 1 capsule by mouth daily.  . vitamin E 1000 UNIT capsule Take  1,000 Units by mouth daily.     Allergies:   Atorvastatin and Metformin hcl   Social History   Socioeconomic History  . Marital status: Married    Spouse name: Not on file  . Number of children: Not on file  . Years of education: Not on file  . Highest education level: Not on file  Occupational History  . Not on file  Tobacco Use  . Smoking status: Never Smoker  . Smokeless tobacco: Never Used  Vaping Use  . Vaping Use: Never used  Substance and Sexual Activity  . Alcohol use: No  . Drug use: No  . Sexual activity: Not on file  Other Topics Concern  . Not on file  Social History Narrative  . Not on file   Social Determinants of Health   Financial Resource Strain:   .  Difficulty of Paying Living Expenses:   Food Insecurity:   . Worried About Charity fundraiser in the Last Year:   . Arboriculturist in the Last Year:   Transportation Needs:   . Film/video editor (Medical):   Marland Kitchen Lack of Transportation (Non-Medical):   Physical Activity:   . Days of Exercise per Week:   . Minutes of Exercise per Session:   Stress:   . Feeling of Stress :   Social Connections:   . Frequency of Communication with Friends and Family:   . Frequency of Social Gatherings with Friends and Family:   . Attends Religious Services:   . Active Member of Clubs or Organizations:   . Attends Archivist Meetings:   Marland Kitchen Marital Status:      Family History: The patient's family history includes Coronary artery disease in her father; Diabetes in her maternal aunt, maternal uncle, and mother. ROS:   Please see the history of present illness.    All 14 point review of systems negative except as described per history of present illness  EKGs/Labs/Other Studies Reviewed:      Recent Labs: No results found for requested labs within last 8760 hours.  Recent Lipid Panel No results found for: CHOL, TRIG, HDL, CHOLHDL, VLDL, LDLCALC, LDLDIRECT  Physical Exam:    VS:  BP 126/80 (BP Location: Right Arm, Patient Position: Sitting, Cuff Size: Normal)   Pulse 83   Ht 5' (1.524 m)   Wt 174 lb 6.4 oz (79.1 kg)   SpO2 99%   BMI 34.06 kg/m     Wt Readings from Last 3 Encounters:  05/23/20 174 lb 6.4 oz (79.1 kg)  11/10/19 177 lb 12.8 oz (80.6 kg)  05/10/19 179 lb (81.2 kg)     GEN:  Well nourished, well developed in no acute distress HEENT: Normal NECK: No JVD; No carotid bruits LYMPHATICS: No lymphadenopathy CARDIAC: RRR, no murmurs, no rubs, no gallops RESPIRATORY:  Clear to auscultation without rales, wheezing or rhonchi  ABDOMEN: Soft, non-tender, non-distended MUSCULOSKELETAL:  No edema; No deformity  SKIN: Warm and dry LOWER EXTREMITIES: no  swelling NEUROLOGIC:  Alert and oriented x 3 PSYCHIATRIC:  Normal affect   ASSESSMENT:    1. Coronary artery disease involving native coronary artery of native heart with angina pectoris ([place])   2. Type 2 diabetes mellitus with hyperglycemia, without long-term current use of insulin (HCC)   3. Dyslipidemia   4. Essential hypertension    PLAN:    In order of problems listed above:  1. Coronary artery disease completely occluded distal LAD.  I will ask  her to have echocardiogram done since her EKG today showed normal sinus rhythm inferior wall myocardial infarction.  She is on appropriate medication which include aspirin as well as high intense statin which I will continue. 2. Type 2 diabetes followed by antimedicine team. 3. Dyslipidemia: I did review K PN last numbers I have is from March 2020 showing LDL of 86 this is unacceptably high however there was another blood test done on 10/02/2019 and I do not have LDL only HDL on that number.  I will contact primary care physician to get copy of her most recent fasting lipid profile.  She is scheduled also to see her primary care physician within the next few weeks and she will have cholesterol redrawn.  She is taking Lipitor 40 probably did be need to increase Lipitor to 80 mg daily. 4. Essential hypertension controlled blood pressure 126/80 today on appropriate medications which I will continue.   Medication Adjustments/Labs and Tests Ordered: Current medicines are reviewed at length with the patient today.  Concerns regarding medicines are outlined above.  No orders of the defined types were placed in this encounter.  Medication changes: No orders of the defined types were placed in this encounter.   Signed, Park Liter, MD, [facility] 05/23/2020 10:00 AM    [place]

## 2020-06-12 DIAGNOSIS — H0102B Squamous blepharitis left eye, upper and lower eyelids: Secondary | ICD-10-CM | POA: Diagnosis not present

## 2020-06-12 DIAGNOSIS — H04129 Dry eye syndrome of unspecified lacrimal gland: Secondary | ICD-10-CM | POA: Diagnosis not present

## 2020-06-12 DIAGNOSIS — L84 Corns and callosities: Secondary | ICD-10-CM | POA: Diagnosis not present

## 2020-06-12 DIAGNOSIS — E1165 Type 2 diabetes mellitus with hyperglycemia: Secondary | ICD-10-CM | POA: Diagnosis not present

## 2020-06-12 DIAGNOSIS — L603 Nail dystrophy: Secondary | ICD-10-CM | POA: Diagnosis not present

## 2020-06-12 DIAGNOSIS — H18511 Endothelial corneal dystrophy, right eye: Secondary | ICD-10-CM | POA: Diagnosis not present

## 2020-06-12 DIAGNOSIS — G8929 Other chronic pain: Secondary | ICD-10-CM | POA: Diagnosis not present

## 2020-06-12 DIAGNOSIS — M25571 Pain in right ankle and joints of right foot: Secondary | ICD-10-CM | POA: Diagnosis not present

## 2020-06-12 DIAGNOSIS — H0102A Squamous blepharitis right eye, upper and lower eyelids: Secondary | ICD-10-CM | POA: Diagnosis not present

## 2020-06-17 ENCOUNTER — Ambulatory Visit (INDEPENDENT_AMBULATORY_CARE_PROVIDER_SITE_OTHER): Payer: PPO

## 2020-06-17 ENCOUNTER — Other Ambulatory Visit: Payer: Self-pay

## 2020-06-17 DIAGNOSIS — I25119 Atherosclerotic heart disease of native coronary artery with unspecified angina pectoris: Secondary | ICD-10-CM | POA: Diagnosis not present

## 2020-06-17 DIAGNOSIS — E785 Hyperlipidemia, unspecified: Secondary | ICD-10-CM

## 2020-06-17 DIAGNOSIS — I10 Essential (primary) hypertension: Secondary | ICD-10-CM

## 2020-06-17 LAB — ECHOCARDIOGRAM COMPLETE
Area-P 1/2: 2.48 cm2
S' Lateral: 2.7 cm

## 2020-06-17 NOTE — Progress Notes (Unsigned)
Complete echocardiogram performed.  Jimmy Tiera Mensinger RDCS, RVT  

## 2020-06-18 ENCOUNTER — Other Ambulatory Visit: Payer: Self-pay

## 2020-07-11 DIAGNOSIS — K760 Fatty (change of) liver, not elsewhere classified: Secondary | ICD-10-CM | POA: Diagnosis not present

## 2020-07-11 DIAGNOSIS — I10 Essential (primary) hypertension: Secondary | ICD-10-CM | POA: Diagnosis not present

## 2020-07-11 DIAGNOSIS — E1165 Type 2 diabetes mellitus with hyperglycemia: Secondary | ICD-10-CM | POA: Diagnosis not present

## 2020-07-11 DIAGNOSIS — E11649 Type 2 diabetes mellitus with hypoglycemia without coma: Secondary | ICD-10-CM | POA: Diagnosis not present

## 2020-08-08 DIAGNOSIS — Z20828 Contact with and (suspected) exposure to other viral communicable diseases: Secondary | ICD-10-CM | POA: Diagnosis not present

## 2020-08-13 DIAGNOSIS — E1140 Type 2 diabetes mellitus with diabetic neuropathy, unspecified: Secondary | ICD-10-CM | POA: Diagnosis not present

## 2020-08-19 DIAGNOSIS — E088 Diabetes mellitus due to underlying condition with unspecified complications: Secondary | ICD-10-CM | POA: Diagnosis not present

## 2020-08-19 DIAGNOSIS — Z01818 Encounter for other preprocedural examination: Secondary | ICD-10-CM | POA: Diagnosis not present

## 2020-09-05 DIAGNOSIS — Z20828 Contact with and (suspected) exposure to other viral communicable diseases: Secondary | ICD-10-CM | POA: Diagnosis not present

## 2020-09-12 DIAGNOSIS — Z1211 Encounter for screening for malignant neoplasm of colon: Secondary | ICD-10-CM | POA: Diagnosis not present

## 2020-09-12 DIAGNOSIS — K635 Polyp of colon: Secondary | ICD-10-CM | POA: Diagnosis not present

## 2020-09-12 DIAGNOSIS — K5730 Diverticulosis of large intestine without perforation or abscess without bleeding: Secondary | ICD-10-CM | POA: Diagnosis not present

## 2020-09-12 DIAGNOSIS — D126 Benign neoplasm of colon, unspecified: Secondary | ICD-10-CM | POA: Diagnosis not present

## 2020-09-12 DIAGNOSIS — I10 Essential (primary) hypertension: Secondary | ICD-10-CM | POA: Diagnosis not present

## 2020-09-12 DIAGNOSIS — D122 Benign neoplasm of ascending colon: Secondary | ICD-10-CM | POA: Diagnosis not present

## 2020-09-12 DIAGNOSIS — E119 Type 2 diabetes mellitus without complications: Secondary | ICD-10-CM | POA: Diagnosis not present

## 2020-09-12 DIAGNOSIS — M1990 Unspecified osteoarthritis, unspecified site: Secondary | ICD-10-CM | POA: Diagnosis not present

## 2020-10-03 DIAGNOSIS — Z20828 Contact with and (suspected) exposure to other viral communicable diseases: Secondary | ICD-10-CM | POA: Diagnosis not present

## 2020-10-04 ENCOUNTER — Ambulatory Visit (HOSPITAL_COMMUNITY)
Admission: RE | Admit: 2020-10-04 | Discharge: 2020-10-04 | Disposition: A | Discharge status: Home / Self Care | Payer: PPO | Source: Ambulatory Visit | Attending: Pulmonary Disease | Admitting: Pulmonary Disease

## 2020-10-04 ENCOUNTER — Ambulatory Visit: Payer: PPO

## 2020-10-04 ENCOUNTER — Ambulatory Visit: Payer: Self-pay | Admitting: *Deleted

## 2020-10-04 ENCOUNTER — Other Ambulatory Visit: Payer: Self-pay | Admitting: Nurse Practitioner

## 2020-10-04 DIAGNOSIS — I10 Essential (primary) hypertension: Secondary | ICD-10-CM

## 2020-10-04 DIAGNOSIS — Z6835 Body mass index (BMI) 35.0-35.9, adult: Secondary | ICD-10-CM | POA: Insufficient documentation

## 2020-10-04 DIAGNOSIS — Z23 Encounter for immunization: Secondary | ICD-10-CM | POA: Insufficient documentation

## 2020-10-04 DIAGNOSIS — U071 COVID-19: Secondary | ICD-10-CM | POA: Diagnosis not present

## 2020-10-04 DIAGNOSIS — E1165 Type 2 diabetes mellitus with hyperglycemia: Secondary | ICD-10-CM | POA: Insufficient documentation

## 2020-10-04 MED ORDER — DIPHENHYDRAMINE HCL 50 MG/ML IJ SOLN: 50.0000 mg | Freq: Once | INTRAMUSCULAR | Status: DC | PRN

## 2020-10-04 MED ORDER — EPINEPHRINE 0.3 MG/0.3ML IJ SOAJ: 0.3000 mg | Freq: Once | INTRAMUSCULAR | Status: DC | PRN

## 2020-10-04 MED ORDER — SODIUM CHLORIDE 0.9 % IV SOLN: INTRAVENOUS | Status: DC | PRN

## 2020-10-04 MED ORDER — SOTROVIMAB 500 MG/8ML IV SOLN
500.0000 mg | Freq: Once | INTRAVENOUS | Status: AC
  Administered 2020-10-04: 500 mg via INTRAVENOUS

## 2020-10-04 MED ORDER — METHYLPREDNISOLONE SODIUM SUCC 125 MG IJ SOLR: 125.0000 mg | Freq: Once | INTRAMUSCULAR | Status: DC | PRN

## 2020-10-04 MED ORDER — ALBUTEROL SULFATE HFA 108 (90 BASE) MCG/ACT IN AERS: 2.0000 | INHALATION_SPRAY | Freq: Once | RESPIRATORY_TRACT | Status: DC | PRN

## 2020-10-04 MED ORDER — FAMOTIDINE IN NACL 20-0.9 MG/50ML-% IV SOLN: 20.0000 mg | Freq: Once | INTRAVENOUS | Status: DC | PRN

## 2020-10-04 NOTE — Telephone Encounter (Signed)
Patient's son is calling for advised on infusion treatment and testing for mother.  She was tested with rapid test at [facility]. Advised COVID + protocol- advised contact PCP and infusion clinic information given.  Reason for Disposition . HIGH RISK for severe COVID complications (e.g., age > 24 years, obesity with BMI > 25, pregnant, chronic lung disease or other chronic medical condition)  (Exception: Already seen by PCP and no new or worsening symptoms.)  Answer Assessment - Initial Assessment Questions 1. COVID-19 EXPOSURE: "Please describe how you were exposed to someone with a COVID-19 infection."     Husband was diagnosed and passed yesterday at hospital 2. PLACE of CONTACT: "Where were you when you were exposed to COVID-19?" (e.g., home, school, medical waiting room; which city?)     home 3. TYPE of CONTACT: "How much contact was there?" (e.g., sitting next to, live in same house, work in same office, same building)     Lives in home 4. DURATION of CONTACT: "How long were you in contact with the COVID-19 patient?" (e.g., a few seconds, passed by person, a few minutes, 15 minutes or longer, live with the patient)     Lives in home 5. MASK: "Were you wearing a mask?" "Was the other person wearing a mask?" Note: wearing a mask reduces the risk of an otherwise close contact.     no 6. DATE of CONTACT: "When did you have contact with a COVID-19 patient?" (e.g., how many days ago)     Lives in the home 7. COMMUNITY SPREAD: "Are there lots of cases of COVID-19 (community spread) where you live?" (See public health department website, if unsure)       yes 8. SYMPTOMS: "Do you have any symptoms?" (e.g., fever, cough, breathing difficulty, loss of taste or smell)     Diarrhea, cough 9. PREGNANCY OR POSTPARTUM: "Is there any chance you are pregnant?" "When was your last menstrual period?" "Did you deliver in the last 2 weeks?"     n/a 10. HIGH RISK: "Do you have any heart or lung  problems?" "Do you have a weak immune system?" (e.g., heart failure, COPD, asthma, HIV positive, chemotherapy, renal failure, diabetes mellitus, sickle cell anemia, obesity)       Age, diabetes 11. TRAVEL: "Have you traveled out of the country recently?" If Yes, ask: "When and where?" Also ask about out-of-state travel, since the CDC has identified some high-risk cities for community spread in the [place]. Note: Travel becomes less relevant if there is widespread community transmission where the patient lives.       No travel  Protocols used: CORONAVIRUS (COVID-19) DIAGNOSED OR SUSPECTED-A-AH, CORONAVIRUS (COVID-19) EXPOSURE-A-AH

## 2020-10-04 NOTE — Progress Notes (Signed)
I connected by phone with Erika Hamilton on 10/04/2020 at 10:45 AM to discuss the potential use of a new treatment for mild to moderate COVID-19 viral infection in non-hospitalized patients.  This patient is a 86 y.o. female that meets the FDA criteria for Emergency Use Authorization of COVID monoclonal antibody casirivimab/imdevimab, bamlanivimab/eteseviamb, or sotrovimab.  Has a (+) direct SARS-CoV-2 viral test result  Has mild or moderate COVID-19   Is NOT hospitalized due to COVID-19  Is within 10 days of symptom onset  Has at least one of the high risk factor(s) for progression to severe COVID-19 and/or hospitalization as defined in EUA.  Specific high risk criteria : Older age (>/= 84 yo), Diabetes and Cardiovascular disease or hypertension   I have spoken and communicated the following to the patient or parent/caregiver regarding COVID monoclonal antibody treatment:  1. FDA has authorized the emergency use for the treatment of mild to moderate COVID-19 in adults and pediatric patients with positive results of direct SARS-CoV-2 viral testing who are 61 years of age and older weighing at least 40 kg, and who are at high risk for progressing to severe COVID-19 and/or hospitalization.  2. The significant known and potential risks and benefits of COVID monoclonal antibody, and the extent to which such potential risks and benefits are unknown.  3. Information on available alternative treatments and the risks and benefits of those alternatives, including clinical trials.  4. Patients treated with COVID monoclonal antibody should continue to self-isolate and use infection control measures (e.g., wear mask, isolate, social distance, avoid sharing personal items, clean and disinfect "high touch" surfaces, and frequent handwashing) according to CDC guidelines.   5. The patient or parent/caregiver has the option to accept or refuse COVID monoclonal antibody treatment.  After  reviewing this information with the patient, the patient has agreed to receive one of the available covid 19 monoclonal antibodies and will be provided an appropriate fact sheet prior to infusion. Jobe Gibbon, NP 10/04/2020 10:45 AM

## 2020-10-04 NOTE — Discharge Instructions (Signed)
10 Things You Can Do to Manage Your COVID-19 Symptoms at Home If you have possible or confirmed COVID-19: 1. Stay home from work and school. And stay away from other public places. If you must go out, avoid using any kind of public transportation, ridesharing, or taxis. 2. Monitor your symptoms carefully. If your symptoms get worse, call your healthcare provider immediately. 3. Get rest and stay hydrated. 4. If you have a medical appointment, call the healthcare provider ahead of time and tell them that you have or may have COVID-19. 5. For medical emergencies, call 911 and notify the dispatch personnel that you have or may have COVID-19. 6. Cover your cough and sneezes with a tissue or use the inside of your elbow. 7. Wash your hands often with soap and water for at least 20 seconds or clean your hands with an alcohol-based hand sanitizer that contains at least 60% alcohol. 8. As much as possible, stay in a specific room and away from other people in your home. Also, you should use a separate bathroom, if available. If you need to be around other people in or outside of the home, wear a mask. 9. Avoid sharing personal items with other people in your household, like dishes, towels, and bedding. 10. Clean all surfaces that are touched often, like counters, tabletops, and doorknobs. Use household cleaning sprays or wipes according to the label instructions. cdc.gov/coronavirus 05/24/2019 This information is not intended to replace advice given to you by your health care provider. Make sure you discuss any questions you have with your health care provider. Document Revised: 10/26/2019 Document Reviewed: 10/26/2019 Elsevier Patient Education  2020 Else[employer] Inc.   COVID-19 COVID-19 is a respiratory infection that is caused by a virus called severe acute respiratory syndrome coronavirus 2 (SARS-CoV-2). The disease is also known as coronavirus disease or novel coronavirus. In some people, the virus may  not cause any symptoms. In others, it may cause a serious infection. The infection can get worse quickly and can lead to complications, such as:  Pneumonia, or infection of the lungs.  Acute respiratory distress syndrome or ARDS. This is a condition in which fluid build-up in the lungs prevents the lungs from filling with air and passing oxygen into the blood.  Acute respiratory failure. This is a condition in which there is not enough oxygen passing from the lungs to the body or when carbon dioxide is not passing from the lungs out of the body.  Sepsis or septic shock. This is a serious bodily reaction to an infection.  Blood clotting problems.  Secondary infections due to bacteria or fungus.  Organ failure. This is when your body's organs stop working. The virus that causes COVID-19 is contagious. This means that it can spread from person to person through droplets from coughs and sneezes (respiratory secretions). What are the causes? This illness is caused by a virus. You may catch the virus by:  Breathing in droplets from an infected person. Droplets can be spread by a person breathing, speaking, singing, coughing, or sneezing.  Touching something, like a table or a doorknob, that was exposed to the virus (contaminated) and then touching your mouth, nose, or eyes. What increases the risk? Risk for infection You are more likely to be infected with this virus if you:  Are within 6 feet (2 meters) of a person with COVID-19.  Provide care for or live with a person who is infected with COVID-19.  Spend time in crowded indoor spaces or   live in shared housing. Risk for serious illness You are more likely to become seriously ill from the virus if you:  Are 50 years of age or older. The higher your age, the more you are at risk for serious illness.  Live in a nursing home or long-term care facility.  Have cancer.  Have a long-term (chronic) disease such as: ? Chronic lung disease,  including chronic obstructive pulmonary disease or asthma. ? A long-term disease that lowers your body's ability to fight infection (immunocompromised). ? Heart disease, including heart failure, a condition in which the arteries that lead to the heart become narrow or blocked (coronary artery disease), a disease which makes the heart muscle thick, weak, or stiff (cardiomyopathy). ? Diabetes. ? Chronic kidney disease. ? Sickle cell disease, a condition in which red blood cells have an abnormal "sickle" shape. ? Liver disease.  Are obese. What are the signs or symptoms? Symptoms of this condition can range from mild to severe. Symptoms may appear any time from 2 to 14 days after being exposed to the virus. They include:  A fever or chills.  A cough.  Difficulty breathing.  Headaches, body aches, or muscle aches.  Runny or stuffy (congested) nose.  A sore throat.  New loss of taste or smell. Some people may also have stomach problems, such as nausea, vomiting, or diarrhea. Other people may not have any symptoms of COVID-19. How is this diagnosed? This condition may be diagnosed based on:  Your signs and symptoms, especially if: ? You live in an area with a COVID-19 outbreak. ? You recently traveled to or from an area where the virus is common. ? You provide care for or live with a person who was diagnosed with COVID-19. ? You were exposed to a person who was diagnosed with COVID-19.  A physical exam.  Lab tests, which may include: ? Taking a sample of fluid from the back of your nose and throat (nasopharyngeal fluid), your nose, or your throat using a swab. ? A sample of mucus from your lungs (sputum). ? Blood tests.  Imaging tests, which may include, X-rays, CT scan, or ultrasound. How is this treated? At present, there is no medicine to treat COVID-19. Medicines that treat other diseases are being used on a trial basis to see if they are effective against COVID-19. Your  health care provider will talk with you about ways to treat your symptoms. For most people, the infection is mild and can be managed at home with rest, fluids, and over-the-counter medicines. Treatment for a serious infection usually takes places in a hospital intensive care unit (ICU). It may include one or more of the following treatments. These treatments are given until your symptoms improve.  Receiving fluids and medicines through an IV.  Supplemental oxygen. Extra oxygen is given through a tube in the nose, a face mask, or a hood.  Positioning you to lie on your stomach (prone position). This makes it easier for oxygen to get into the lungs.  Continuous positive airway pressure (CPAP) or bi-level positive airway pressure (BPAP) machine. This treatment uses mild air pressure to keep the airways open. A tube that is connected to a motor delivers oxygen to the body.  Ventilator. This treatment moves air into and out of the lungs by using a tube that is placed in your windpipe.  Tracheostomy. This is a procedure to create a hole in the neck so that a breathing tube can be inserted.  Extracorporeal membrane   oxygenation (ECMO). This procedure gives the lungs a chance to recover by taking over the functions of the heart and lungs. It supplies oxygen to the body and removes carbon dioxide. Follow these instructions at home: Lifestyle  If you are sick, stay home except to get medical care. Your health care provider will tell you how long to stay home. Call your health care provider before you go for medical care.  Rest at home as told by your health care provider.  Do not use any products that contain nicotine or tobacco, such as cigarettes, e-cigarettes, and chewing tobacco. If you need help quitting, ask your health care provider.  Return to your normal activities as told by your health care provider. Ask your health care provider what activities are safe for you. General  instructions  Take over-the-counter and prescription medicines only as told by your health care provider.  Drink enough fluid to keep your urine pale yellow.  Keep all follow-up visits as told by your health care provider. This is important. How is this prevented?  There is no vaccine to help prevent COVID-19 infection. However, there are steps you can take to protect yourself and others from this virus. To protect yourself:   Do not travel to areas where COVID-19 is a risk. The areas where COVID-19 is reported change often. To identify high-risk areas and travel restrictions, check the CDC travel website: wwwnc.cdc.gov/travel/notices  If you live in, or must travel to, an area where COVID-19 is a risk, take precautions to avoid infection. ? Stay away from people who are sick. ? Wash your hands often with soap and water for 20 seconds. If soap and water are not available, use an alcohol-based hand sanitizer. ? Avoid touching your mouth, face, eyes, or nose. ? Avoid going out in public, follow guidance from your state and local health authorities. ? If you must go out in public, wear a cloth face covering or face mask. Make sure your mask covers your nose and mouth. ? Avoid crowded indoor spaces. Stay at least 6 feet (2 meters) away from others. ? Disinfect objects and surfaces that are frequently touched every day. This may include:  Counters and tables.  Doorknobs and light switches.  Sinks and faucets.  Electronics, such as phones, remote controls, keyboards, computers, and tablets. To protect others: If you have symptoms of COVID-19, take steps to prevent the virus from spreading to others.  If you think you have a COVID-19 infection, contact your health care provider right away. Tell your health care team that you think you may have a COVID-19 infection.  Stay home. Leave your house only to seek medical care. Do not use public transport.  Do not travel while you are  sick.  Wash your hands often with soap and water for 20 seconds. If soap and water are not available, use alcohol-based hand sanitizer.  Stay away from other members of your household. Let healthy household members care for children and pets, if possible. If you have to care for children or pets, wash your hands often and wear a mask. If possible, stay in your own room, separate from others. Use a different bathroom.  Make sure that all people in your household wash their hands well and often.  Cough or sneeze into a tissue or your sleeve or elbow. Do not cough or sneeze into your hand or into the air.  Wear a cloth face covering or face mask. Make sure your mask covers your nose   and mouth. Where to find more information  Centers for Disease Control and Prevention: www.cdc.gov/coronavirus/2019-ncov/index.html  World Health Organization: www.who.int/health-topics/coronavirus Contact a health care provider if:  You live in or have traveled to an area where COVID-19 is a risk and you have symptoms of the infection.  You have had contact with someone who has COVID-19 and you have symptoms of the infection. Get help right away if:  You have trouble breathing.  You have pain or pressure in your chest.  You have confusion.  You have bluish lips and fingernails.  You have difficulty waking from sleep.  You have symptoms that get worse. These symptoms may represent a serious problem that is an emergency. Do not wait to see if the symptoms will go away. Get medical help right away. Call your local emergency services (911 in the [place] Do not drive yourself to the hospital. Let the emergency medical personnel know if you think you have COVID-19. Summary  COVID-19 is a respiratory infection that is caused by a virus. It is also known as coronavirus disease or novel coronavirus. It can cause serious infections, such as pneumonia, acute respiratory distress syndrome, acute respiratory failure,  or sepsis.  The virus that causes COVID-19 is contagious. This means that it can spread from person to person through droplets from breathing, speaking, singing, coughing, or sneezing.  You are more likely to develop a serious illness if you are 50 years of age or older, have a weak immune system, live in a nursing home, or have chronic disease.  There is no medicine to treat COVID-19. Your health care provider will talk with you about ways to treat your symptoms.  Take steps to protect yourself and others from infection. Wash your hands often and disinfect objects and surfaces that are frequently touched every day. Stay away from people who are sick and wear a mask if you are sick. This information is not intended to replace advice given to you by your health care provider. Make sure you discuss any questions you have with your health care provider. Document Revised: 09/08/2019 Document Reviewed: 12/15/2018 Elsevier Patient Education  2020 Elsevier Inc.  What types of side effects do monoclonal antibody drugs cause?  Common side effects  In general, the more common side effects caused by monoclonal antibody drugs include: . Allergic reactions, such as hives or itching . Flu-like signs and symptoms, including chills, fatigue, fever, and muscle aches and pains . Nausea, vomiting . Diarrhea . Skin rashes . Low blood pressure   The CDC is recommending patients who receive monoclonal antibody treatments wait at least 90 days before being vaccinated.  Currently, there are no data on the safety and efficacy of mRNA COVID-19 vaccines in persons who received monoclonal antibodies or convalescent plasma as part of COVID-19 treatment. Based on the estimated half-life of such therapies as well as evidence suggesting that reinfection is uncommon in the 90 days after initial infection, vaccination should be deferred for at least 90 days, as a precautionary measure until additional information becomes  available, to avoid interference of the antibody treatment with vaccine-induced immune responses.  

## 2020-10-04 NOTE — Progress Notes (Signed)
Note: BP slightly elevated on arrival and post infusion- patient will take her BP medications when she gets home. "[facility]" will be taking patient home, today.   Diagnosis: COVID-19  Physician: Dr. Asencion Noble  Procedure: [facility] Med: Sotrovimab infusion - Provided patient with sotrovimab fact sheet for patients, parents, and caregivers prior to infusion.   Complications: No immediate complications noted  Discharge: Discharged home  Monna Fam 11/02/2020

## 2020-10-22 DIAGNOSIS — N390 Urinary tract infection, site not specified: Secondary | ICD-10-CM | POA: Diagnosis not present

## 2020-10-22 DIAGNOSIS — Z6832 Body mass index (BMI) 32.0-32.9, adult: Secondary | ICD-10-CM | POA: Diagnosis not present

## 2020-10-30 DIAGNOSIS — L282 Other prurigo: Secondary | ICD-10-CM | POA: Diagnosis not present

## 2020-10-30 DIAGNOSIS — Z6832 Body mass index (BMI) 32.0-32.9, adult: Secondary | ICD-10-CM | POA: Diagnosis not present

## 2020-11-14 ENCOUNTER — Other Ambulatory Visit: Payer: Self-pay

## 2020-11-14 DIAGNOSIS — I219 Acute myocardial infarction, unspecified: Secondary | ICD-10-CM | POA: Insufficient documentation

## 2020-11-14 DIAGNOSIS — C801 Malignant (primary) neoplasm, unspecified: Secondary | ICD-10-CM | POA: Insufficient documentation

## 2020-11-14 DIAGNOSIS — E119 Type 2 diabetes mellitus without complications: Secondary | ICD-10-CM | POA: Insufficient documentation

## 2020-11-14 DIAGNOSIS — Z9889 Other specified postprocedural states: Secondary | ICD-10-CM | POA: Insufficient documentation

## 2020-11-14 DIAGNOSIS — R011 Cardiac murmur, unspecified: Secondary | ICD-10-CM | POA: Insufficient documentation

## 2020-11-14 DIAGNOSIS — K219 Gastro-esophageal reflux disease without esophagitis: Secondary | ICD-10-CM | POA: Insufficient documentation

## 2020-11-14 DIAGNOSIS — I10 Essential (primary) hypertension: Secondary | ICD-10-CM | POA: Insufficient documentation

## 2020-11-14 DIAGNOSIS — K859 Acute pancreatitis without necrosis or infection, unspecified: Secondary | ICD-10-CM | POA: Insufficient documentation

## 2020-11-25 ENCOUNTER — Ambulatory Visit: Payer: PPO | Admitting: Cardiology

## 2020-11-26 ENCOUNTER — Other Ambulatory Visit: Payer: Self-pay

## 2020-11-26 ENCOUNTER — Ambulatory Visit: Payer: PPO | Admitting: Cardiology

## 2020-11-26 ENCOUNTER — Encounter: Payer: Self-pay | Admitting: Cardiology

## 2020-11-26 VITALS — BP 128/74 | HR 75 | Ht 60.0 in | Wt 166.0 lb

## 2020-11-26 DIAGNOSIS — E785 Hyperlipidemia, unspecified: Secondary | ICD-10-CM | POA: Diagnosis not present

## 2020-11-26 DIAGNOSIS — E1165 Type 2 diabetes mellitus with hyperglycemia: Secondary | ICD-10-CM

## 2020-11-26 DIAGNOSIS — I10 Essential (primary) hypertension: Secondary | ICD-10-CM | POA: Diagnosis not present

## 2020-11-26 DIAGNOSIS — I25119 Atherosclerotic heart disease of native coronary artery with unspecified angina pectoris: Secondary | ICD-10-CM | POA: Diagnosis not present

## 2020-11-26 NOTE — Patient Instructions (Signed)
Medication Instructions:  .isntcu  *If you need a refill on your cardiac medications before your next appointment, please call your pharmacy*   Lab Work: None. If you have labs (blood work) drawn today and your tests are completely normal, you will receive your results only by: . MyChart Message (if you have MyChart) OR . A paper copy in the mail If you have any lab test that is abnormal or we need to change your treatment, we will call you to review the results.   Testing/Procedures: None.   Follow-Up: At [facility], you and your health needs are our priority.  As part of our continuing mission to provide you with exceptional heart care, we have created designated Provider Care Teams.  These Care Teams include your primary Cardiologist (physician) and Advanced Practice Providers (APPs -  Physician Assistants and Nurse Practitioners) who all work together to provide you with the care you need, when you need it.  We recommend signing up for the patient portal called "MyChart".  Sign up information is provided on this After Visit Summary.  MyChart is used to connect with patients for Virtual Visits (Telemedicine).  Patients are able to view lab/test results, encounter notes, upcoming appointments, etc.  Non-urgent messages can be sent to your provider as well.   To learn more about what you can do with MyChart, go to [email or web address].    Your next appointment:   6 month(s)  The format for your next appointment:   In Person  Provider:   Robert Krasowski, MD   Other Instructions    

## 2020-11-26 NOTE — Progress Notes (Signed)
Cardiology Office Note:    Date:  11/26/2020   ID:  Erika Hamilton, DOB 06/02/1933, MRN [id number]  PCP:  Ernestene Kiel, MD  Cardiologist:  Jenne Campus, MD    Referring MD: Ernestene Kiel, MD   No chief complaint on file. I am doing fine cardiac wise but I lost my husband of 20 years  History of Present Illness:    Erika Hamilton is a 87 y.o. female with past medical history significant for coronary artery disease. She did have a cardiac catheterization 2015 she was found to have completely occluded distal portion of the left internal descending artery. No intervention was required at that time. Her past medical history is also significant for essential hypertension, dyslipidemia, diabetes. She comes today to my office for follow-up. Overall seems to be doing well. Denies have any chest pain tightness squeezing pressure burning chest. She is trying to be active but because of poor weather she have difficulty going outside and walking but she does have a treadmill at home that she is sometimes.  Past Medical History:  Diagnosis Date  . Bereavement 05/04/2018   one of her sons was killed by a drunk driver in May [id number]  . Cancer (HCC)    breast, tongue, skin cancer  . Coronary artery disease involving native coronary artery of native heart with angina pectoris ([place]) 12/25/2014   Overview:  Severe stenosis of distal LAD & s/p NSTEMI in Sep 2015  . Diabetes mellitus without complication ([place])   . Dyslipidemia 12/25/2014   Overview:  11/06/2014 - TC 165/Trig 117/HDL 53/LDL 89  . Essential hypertension 12/25/2014  . Gall bladder disease 05/31/2014  . Gastroesophageal reflux disease without esophagitis 12/25/2014  . Generalized osteoarthritis 12/25/2014  . GERD (gastroesophageal reflux disease)   . Headache(784.0)   . Heart murmur   . Hepatic steatosis 12/25/2014  . Hx of repair of rotator cuff   . Hyperlipidemia associated with type 2 diabetes mellitus ([place])  12/25/2014   Formatting of this note might be different from the original. Overview:  11/06/2014 - TC 165/Trig 117/HDL 53/LDL 89  Overview:  11/06/2014 - TC 165/Trig 117/HDL 53/LDL 89 Formatting of this note might be different from the original. 11/06/2014 - TC 165/Trig 117/HDL 53/LDL 89  . Hypertension   . Insomnia due to medical condition 01/13/2017  . Malignant neoplasm of left female breast ([place]) 12/25/2014  . MI (myocardial infarction) ([place])   . Pancreatitis   . Severe obesity (BMI 35.0-39.9) with comorbidity ([place]) 12/25/2014  . Type 2 diabetes mellitus with hyperglycemia ([place]) 12/25/2014   Overview:  11/06/2014 HgbA1C was 8.0% (Micral/Creat ratio same date was normal at 7.7 - < 30)  . Type 2 diabetes mellitus with hyperglycemia, without long-term current use of insulin ([place]) 12/25/2014   11/06/2014 HgbA1C was 8.0% (Micral/Creat ratio same date was normal at 7.7 - < 30)    Past Surgical History:  Procedure Laterality Date  . BREAST SURGERY Left    lumpectomy  . CHOLECYSTECTOMY N/A 07/09/2014   Procedure: LAPAROSCOPIC CHOLECYSTECTOMY WITH INTRAOPERATIVE CHOLANGIOGRAM;  Surgeon: Shann Medal, MD;  Location: [facility];  Service: General;  Laterality: N/A;  . CORONARY ANGIOPLASTY    . HERNIA REPAIR     umbilical  . SHOULDER SURGERY    . TONGUE SURGERY     removal of cancerous mass  . TONSILLECTOMY      Current Medications: No outpatient medications have been marked as taking for the 11/26/20 encounter (Appointment) with Agustin Cree,  Marveen Reeks, MD.     Allergies:   Atorvastatin and Metformin hcl   Social History   Socioeconomic History  . Marital status: Married    Spouse name: Not on file  . Number of children: Not on file  . Years of education: Not on file  . Highest education level: Not on file  Occupational History  . Not on file  Tobacco Use  . Smoking status: Never Smoker  . Smokeless tobacco: Never Used  Vaping Use  . Vaping Use: Never used  Substance and Sexual Activity  .  Alcohol use: No  . Drug use: No  . Sexual activity: Not on file  Other Topics Concern  . Not on file  Social History Narrative  . Not on file   Social Determinants of Health   Financial Resource Strain: Not on file  Food Insecurity: Not on file  Transportation Needs: Not on file  Physical Activity: Not on file  Stress: Not on file  Social Connections: Not on file     Family History: The patient's family history includes Coronary artery disease in her father; Diabetes in her maternal aunt, maternal uncle, and mother. ROS:   Please see the history of present illness.    All 14 point review of systems negative except as described per history of present illness  EKGs/Labs/Other Studies Reviewed:      Recent Labs: No results found for requested labs within last 8760 hours.  Recent Lipid Panel No results found for: CHOL, TRIG, HDL, CHOLHDL, VLDL, LDLCALC, LDLDIRECT  Physical Exam:    VS:  There were no vitals taken for this visit.    Wt Readings from Last 3 Encounters:  05/23/20 174 lb 6.4 oz (79.1 kg)  11/10/19 177 lb 12.8 oz (80.6 kg)  05/10/19 179 lb (81.2 kg)     GEN:  Well nourished, well developed in no acute distress HEENT: Normal NECK: No JVD; No carotid bruits LYMPHATICS: No lymphadenopathy CARDIAC: RRR, no murmurs, no rubs, no gallops RESPIRATORY:  Clear to auscultation without rales, wheezing or rhonchi  ABDOMEN: Soft, non-tender, non-distended MUSCULOSKELETAL:  No edema; No deformity  SKIN: Warm and dry LOWER EXTREMITIES: no swelling NEUROLOGIC:  Alert and oriented x 3 PSYCHIATRIC:  Normal affect   ASSESSMENT:    1. Coronary artery disease involving native coronary artery of native heart with angina pectoris (HCC)   2. Essential hypertension   3. Type 2 diabetes mellitus with hyperglycemia, without long-term current use of insulin (HCC)   4. Dyslipidemia    PLAN:    In order of problems listed above:  1. Coronary disease stable from that point  review denies have any symptoms. There is no chest pain tightness squeezing pressure burning chest. 2. Essential hypertension: Blood pressure well controlled continue present medications. 3. Type 2 diabetes followed by antimedicine team I do have last hemoglobin A1c from last year which is 7.3. Will contact her primary care physician office and will get copy of her latest fasting lipid profile and hemoglobin A1c. 4. Dyslipidemia she is on high intensity statin which I will continue, will call primary care physician to get her fasting lipid profile. 5. I encouraged her to be L be more active which she will try to do.   Medication Adjustments/Labs and Tests Ordered: Current medicines are reviewed at length with the patient today.  Concerns regarding medicines are outlined above.  No orders of the defined types were placed in this encounter.  Medication changes: No orders of  the defined types were placed in this encounter.   Signed, Park Liter, MD, [facility] 11/26/2020 10:31 AM    [place]

## 2020-12-02 DIAGNOSIS — K760 Fatty (change of) liver, not elsewhere classified: Secondary | ICD-10-CM | POA: Diagnosis not present

## 2020-12-02 DIAGNOSIS — I10 Essential (primary) hypertension: Secondary | ICD-10-CM | POA: Diagnosis not present

## 2020-12-02 DIAGNOSIS — Z6837 Body mass index (BMI) 37.0-37.9, adult: Secondary | ICD-10-CM | POA: Diagnosis not present

## 2020-12-02 DIAGNOSIS — E1165 Type 2 diabetes mellitus with hyperglycemia: Secondary | ICD-10-CM | POA: Diagnosis not present

## 2021-01-02 DIAGNOSIS — Z961 Presence of intraocular lens: Secondary | ICD-10-CM | POA: Diagnosis not present

## 2021-01-13 DIAGNOSIS — Z79899 Other long term (current) drug therapy: Secondary | ICD-10-CM | POA: Diagnosis not present

## 2021-01-13 DIAGNOSIS — E785 Hyperlipidemia, unspecified: Secondary | ICD-10-CM | POA: Diagnosis not present

## 2021-01-13 DIAGNOSIS — I10 Essential (primary) hypertension: Secondary | ICD-10-CM | POA: Diagnosis not present

## 2021-01-13 DIAGNOSIS — E1165 Type 2 diabetes mellitus with hyperglycemia: Secondary | ICD-10-CM | POA: Diagnosis not present

## 2021-02-04 DIAGNOSIS — R110 Nausea: Secondary | ICD-10-CM | POA: Diagnosis not present

## 2021-02-12 DIAGNOSIS — Z6832 Body mass index (BMI) 32.0-32.9, adult: Secondary | ICD-10-CM | POA: Diagnosis not present

## 2021-02-12 DIAGNOSIS — R109 Unspecified abdominal pain: Secondary | ICD-10-CM | POA: Diagnosis not present

## 2021-02-12 DIAGNOSIS — R112 Nausea with vomiting, unspecified: Secondary | ICD-10-CM | POA: Diagnosis not present

## 2021-02-13 DIAGNOSIS — R109 Unspecified abdominal pain: Secondary | ICD-10-CM | POA: Diagnosis not present

## 2021-02-13 DIAGNOSIS — R1110 Vomiting, unspecified: Secondary | ICD-10-CM | POA: Diagnosis not present

## 2021-02-13 DIAGNOSIS — R1084 Generalized abdominal pain: Secondary | ICD-10-CM | POA: Diagnosis not present

## 2021-02-13 DIAGNOSIS — D72820 Lymphocytosis (symptomatic): Secondary | ICD-10-CM | POA: Diagnosis not present

## 2021-02-25 NOTE — Progress Notes (Incomplete)
[facility]  [address],  [place]  [address] [phone]  Clinic Day:  02/25/2021  Referring physician: Ernestene Kiel, MD  This document serves as a record of services personally performed by Hosie Poisson, MD. It was created on their behalf by Curry,Lauren E, a trained medical scribe. The creation of this record is based on the scribe's personal observations and the provider's statements to them.  CHIEF COMPLAINT:  CC: Stage III (T1c N2 M0) left breast cancer  Current Treatment:  Surveillance   HISTORY OF PRESENT ILLNESS:  Erika Hamilton is a 87 y.o. female with stage III left breast cancer diagnosed in September 1998 with positive nodes for a T1c N2 M0.  She was treated with lumpectomy and adjuvant chemotherapy, which consisted of doxorubicin and cyclophosphamide for 4 cycles, followed by paclitaxel for 4 cycles.  She had postoperative radiation and then took tamoxifen for 5 years, followed by extended adjuvant hormonal therapy with Femara for an additional 5 years.  She also has osteopenia, but is off the Fosamax after being on it for over 5 years.  Her last bone density scan was in March of 2016 which revealed stable osteopenia.  She had a squamous cell carcinoma of the tongue in September 2012, which was a superficial invasive, well-differentiated type, treated with partial glossectomy.  Pathology revealed a 0.4 centimeter lesion with clear margins and negative nodes for a T1 N0 M0, stage I.  She now sees Dr. Ernestene Kiel for her primary care and is followed by Dr. Danie Binder with [facility] for prior myocardial infarction in 2015.  She has had diabetes mellitus for over 15 years.  She had a cholecystectomy in August of 2015 by Dr. Alphonsa Overall.  She had some minimal nipple discharge earlier this year, and so had an ultrasound of the breast in addition to her yearly mammogram in February.  This was negative  but I saw her back in August of 2017 because of persistent nipple discharge.  She has noticed some discomfort in her right breast with just a mild ache but no fever or erythema.  Normally the discharge was clear but occurred daily and occasionally was red tinged.  She described 1-2 drops and it had been going on for over 6 months.  We therefore referred her for repeat imaging which included a digital Tomosynthesis and the duct in the right breast was indeterminate.  Ultrasound revealed a mass centrally in the retroareolar area, measuring 1.3 cm and likely a papilloma.  She was therefore referred to Dr. Alphonsa Overall, who performed an excision of this area on October 12th.  Pathology revealed an intraductal papilloma with no malignancy identified.  I reviewed that result with her and her husband in December of 2017.  She had repeat bilateral mammography in February of 2018 which was clear.    She is here for annual follow up after not being seen in 2020 due to the coronavirus pandemic.  Annual bilateral screening mammogram from March was clear.  She states that she is well and denies complaints.  Her white count is mildly elevated at 11.9, and her hemoglobin and platelets are normal.  She denies any signs of infection.  Her chemistries are unremarkable except for a BUN of 23.  Her appetite is good, but she has lost 5 1/2 pounds since her last visit.  She denies fever, or chills.  She denies nausea, vomiting, bowel issues, or abdominal pain.  She denies  sore throat, cough, dyspnea, or chest pain.  She has received both of her COVID vaccines.  INTERVAL HISTORY:  Erika Hamilton is here for annual follow up ***.   Her  appetite is good, and she has gained/lost _ pounds since her last visit.  She denies fever, chills or other signs of infection.  She denies nausea, vomiting, bowel issues, or abdominal pain.  She denies sore throat, cough, dyspnea, or chest pain.  REVIEW OF SYSTEMS:  Review of Systems - Oncology    VITALS:  There were no vitals taken for this visit.  Wt Readings from Last 3 Encounters:  11/26/20 166 lb (75.3 kg)  05/23/20 174 lb 6.4 oz (79.1 kg)  11/10/19 177 lb 12.8 oz (80.6 kg)    There is no height or weight on file to calculate BMI.  Performance status (ECOG): {CHL ONC [id number]  PHYSICAL EXAM:  Physical Exam  LABS:   CBC Latest Ref Rng & Units 07/05/2014  WBC 4.0 - 10.5 K/uL 9.7  Hemoglobin 12.0 - 15.0 g/dL 14.0  Hematocrit 36.0 - 46.0 % 43.7  Platelets 150 - 400 K/uL 275   CMP Latest Ref Rng & Units 07/05/2014  Glucose 70 - 99 mg/dL 173(H)  BUN 6 - 23 mg/dL 13  Creatinine 0.50 - 1.10 mg/dL 0.59  Sodium 137 - 147 mEq/L 139  Potassium 3.7 - 5.3 mEq/L 4.3  Chloride 96 - 112 mEq/L 100  CO2 19 - 32 mEq/L 22  Calcium 8.4 - 10.5 mg/dL 9.1  Total Protein 6.0 - 8.3 g/dL 7.3  Total Bilirubin 0.3 - 1.2 mg/dL 0.4  Alkaline Phos 39 - 117 U/L 77  AST 0 - 37 U/L 22  ALT 0 - 35 U/L 22    STUDIES:  No results found.   Allergies:  Allergies  Allergen Reactions  . Atorvastatin   . Metformin Hcl     Current Medications: Current Outpatient Medications  Medication Sig Dispense Refill  . aspirin EC 81 MG tablet Take 81 mg by mouth daily.    Marland Kitchen atorvastatin (LIPITOR) 40 MG tablet Take 40 mg by mouth daily. 1 tab every other day alternating with 1/2 tab every other day.    . calcium-vitamin D (OSCAL WITH D) 500-200 MG-UNIT tablet Take 2 tablets by mouth daily with breakfast.    . carvedilol (COREG) 12.5 MG tablet Take 25 mg by mouth 2 (two) times daily with a meal.     . ferrous sulfate 325 (65 FE) MG EC tablet Take 325 mg by mouth daily with breakfast.    . ketoconazole (NIZORAL) 2 % shampoo SHAMPOO HAIR; LATHER IN AND RINSE OUT TWICE A WEEK AS NEEDED FOR SCALP    . nystatin cream (MYCOSTATIN) Apply 1 application topically 2 (two) times daily.    . Omega-3 Fatty Acids (FISH OIL) 1200 MG CAPS Take 1,200 mg by mouth in the morning and at bedtime.    . pantoprazole  (PROTONIX) 20 MG tablet Take 20 mg by mouth daily.     . quinapril (ACCUPRIL) 40 MG tablet Take 40 mg by mouth daily.     Tyler Aas FLEXTOUCH 200 UNIT/ML FlexTouch Pen Inject 40 Units into the skin daily.     No current facility-administered medications for this visit.     ASSESSMENT & PLAN:   Assessment:   1. History of stage III left breast cancer diagnosed in September 1998, now over 21 1/2 years ago treated with surgery, radiation, chemotherapy and hormonal therapy.  She has no  evidence of disease.  2. Stable osteopenia.  3. Squamous cell carcinoma of the tongue diagnosed in September 2012, stage I and treated with partial glossectomy.  4. History of papilloma of the right breast in October of 2017, causing nipple discharge, but benign and now resected.  Plan: She is doing well, so I will see her back in 1 year with CBC, comprehensive metabolic profile, and bilateral mammogram.  She understands and agrees with this plan of care.   I provided *** minutes (9:06 AM - 9:06 AM) of face-to-face time during this this encounter and > 50% was spent counseling as documented under my assessment and plan.    Derwood Kaplan, MD [facility] AT [facility] [address] [place] [address] Dept: [phone] Dept Fax: [phone]   I, Rita Ohara, am acting as scribe for Derwood Kaplan, MD  I have reviewed this report as typed by the medical scribe, and it is complete and accurate.  Hermina Barters

## 2021-02-27 ENCOUNTER — Inpatient Hospital Stay: Payer: PPO

## 2021-02-27 ENCOUNTER — Inpatient Hospital Stay: Payer: PPO | Admitting: Oncology

## 2021-02-27 DIAGNOSIS — D72828 Other elevated white blood cell count: Secondary | ICD-10-CM | POA: Diagnosis not present

## 2021-03-17 DIAGNOSIS — Z1231 Encounter for screening mammogram for malignant neoplasm of breast: Secondary | ICD-10-CM | POA: Diagnosis not present

## 2021-03-21 DIAGNOSIS — D72828 Other elevated white blood cell count: Secondary | ICD-10-CM | POA: Diagnosis not present

## 2021-04-01 ENCOUNTER — Encounter: Payer: Self-pay | Admitting: Oncology

## 2021-04-02 DIAGNOSIS — K529 Noninfective gastroenteritis and colitis, unspecified: Secondary | ICD-10-CM | POA: Diagnosis not present

## 2021-04-23 ENCOUNTER — Telehealth: Payer: Self-pay | Admitting: Cardiology

## 2021-04-23 NOTE — Telephone Encounter (Signed)
    Pt c/o medication issue:  1. Name of Medication: nitroglycerin  2. How are you currently taking this medication (dosage and times per day)?   3. Are you having a reaction (difficulty breathing--STAT)?   4. What is your medication issue? Pt said she needs a new bottle because the one she have is out of date, not on her med list. She said to send prescription on her pharmacy on file

## 2021-04-24 MED ORDER — NITROGLYCERIN 0.4 MG SL SUBL: 0.4000 mg | SUBLINGUAL_TABLET | SUBLINGUAL | 11 refills | Status: DC | PRN

## 2021-04-24 NOTE — Telephone Encounter (Signed)
Medication filled.  

## 2021-04-28 DIAGNOSIS — D72829 Elevated white blood cell count, unspecified: Secondary | ICD-10-CM | POA: Diagnosis not present

## 2021-05-13 DIAGNOSIS — K760 Fatty (change of) liver, not elsewhere classified: Secondary | ICD-10-CM | POA: Diagnosis not present

## 2021-05-13 DIAGNOSIS — I10 Essential (primary) hypertension: Secondary | ICD-10-CM | POA: Diagnosis not present

## 2021-05-13 DIAGNOSIS — E1165 Type 2 diabetes mellitus with hyperglycemia: Secondary | ICD-10-CM | POA: Diagnosis not present

## 2021-05-13 DIAGNOSIS — I2510 Atherosclerotic heart disease of native coronary artery without angina pectoris: Secondary | ICD-10-CM | POA: Diagnosis not present

## 2021-05-13 DIAGNOSIS — Z6837 Body mass index (BMI) 37.0-37.9, adult: Secondary | ICD-10-CM | POA: Diagnosis not present

## 2021-06-02 DIAGNOSIS — Z86010 Personal history of colonic polyps: Secondary | ICD-10-CM | POA: Diagnosis not present

## 2021-06-02 DIAGNOSIS — R935 Abnormal findings on diagnostic imaging of other abdominal regions, including retroperitoneum: Secondary | ICD-10-CM | POA: Diagnosis not present

## 2021-06-02 DIAGNOSIS — K219 Gastro-esophageal reflux disease without esophagitis: Secondary | ICD-10-CM | POA: Diagnosis not present

## 2021-06-06 DIAGNOSIS — Z79899 Other long term (current) drug therapy: Secondary | ICD-10-CM | POA: Diagnosis not present

## 2021-06-06 DIAGNOSIS — Z1339 Encounter for screening examination for other mental health and behavioral disorders: Secondary | ICD-10-CM | POA: Diagnosis not present

## 2021-06-06 DIAGNOSIS — I10 Essential (primary) hypertension: Secondary | ICD-10-CM | POA: Diagnosis not present

## 2021-06-06 DIAGNOSIS — I25119 Atherosclerotic heart disease of native coronary artery with unspecified angina pectoris: Secondary | ICD-10-CM | POA: Diagnosis not present

## 2021-06-06 DIAGNOSIS — E1165 Type 2 diabetes mellitus with hyperglycemia: Secondary | ICD-10-CM | POA: Diagnosis not present

## 2021-06-06 DIAGNOSIS — E785 Hyperlipidemia, unspecified: Secondary | ICD-10-CM | POA: Diagnosis not present

## 2021-06-06 DIAGNOSIS — Z0000 Encounter for general adult medical examination without abnormal findings: Secondary | ICD-10-CM | POA: Diagnosis not present

## 2021-06-06 DIAGNOSIS — E1169 Type 2 diabetes mellitus with other specified complication: Secondary | ICD-10-CM | POA: Diagnosis not present

## 2021-06-06 DIAGNOSIS — R350 Frequency of micturition: Secondary | ICD-10-CM | POA: Diagnosis not present

## 2021-06-06 DIAGNOSIS — Z1331 Encounter for screening for depression: Secondary | ICD-10-CM | POA: Diagnosis not present

## 2021-06-06 DIAGNOSIS — Z794 Long term (current) use of insulin: Secondary | ICD-10-CM | POA: Diagnosis not present

## 2021-06-13 DIAGNOSIS — D72829 Elevated white blood cell count, unspecified: Secondary | ICD-10-CM | POA: Diagnosis not present

## 2021-08-14 DIAGNOSIS — E1165 Type 2 diabetes mellitus with hyperglycemia: Secondary | ICD-10-CM | POA: Diagnosis not present

## 2021-08-14 DIAGNOSIS — E782 Mixed hyperlipidemia: Secondary | ICD-10-CM | POA: Diagnosis not present

## 2021-08-14 DIAGNOSIS — I10 Essential (primary) hypertension: Secondary | ICD-10-CM | POA: Diagnosis not present

## 2021-08-14 DIAGNOSIS — E11649 Type 2 diabetes mellitus with hypoglycemia without coma: Secondary | ICD-10-CM | POA: Diagnosis not present

## 2021-08-14 DIAGNOSIS — I2510 Atherosclerotic heart disease of native coronary artery without angina pectoris: Secondary | ICD-10-CM | POA: Diagnosis not present

## 2021-08-14 DIAGNOSIS — Z634 Disappearance and death of family member: Secondary | ICD-10-CM | POA: Diagnosis not present

## 2021-08-14 DIAGNOSIS — K760 Fatty (change of) liver, not elsewhere classified: Secondary | ICD-10-CM | POA: Diagnosis not present

## 2021-08-14 DIAGNOSIS — Z6837 Body mass index (BMI) 37.0-37.9, adult: Secondary | ICD-10-CM | POA: Diagnosis not present

## 2021-08-18 ENCOUNTER — Other Ambulatory Visit: Payer: Self-pay

## 2021-08-18 ENCOUNTER — Encounter: Payer: Self-pay | Admitting: Cardiology

## 2021-08-18 ENCOUNTER — Ambulatory Visit: Payer: PPO | Admitting: Cardiology

## 2021-08-18 VITALS — BP 134/72 | HR 71 | Ht 60.0 in | Wt 165.6 lb

## 2021-08-18 DIAGNOSIS — I25119 Atherosclerotic heart disease of native coronary artery with unspecified angina pectoris: Secondary | ICD-10-CM | POA: Diagnosis not present

## 2021-08-18 DIAGNOSIS — Z23 Encounter for immunization: Secondary | ICD-10-CM

## 2021-08-18 DIAGNOSIS — E785 Hyperlipidemia, unspecified: Secondary | ICD-10-CM | POA: Diagnosis not present

## 2021-08-18 DIAGNOSIS — I10 Essential (primary) hypertension: Secondary | ICD-10-CM

## 2021-08-18 DIAGNOSIS — E1165 Type 2 diabetes mellitus with hyperglycemia: Secondary | ICD-10-CM

## 2021-08-18 NOTE — Patient Instructions (Signed)

## 2021-08-18 NOTE — Progress Notes (Signed)
Cardiology Office Note:    Date:  08/18/2021   ID:  Erika Hamilton, DOB [birth year], MRN [id number]  PCP:  Ernestene Kiel, MD  Cardiologist:  Jenne Campus, MD    Referring MD: Ernestene Kiel, MD   Chief Complaint  Patient presents with   Results  Doing very well  History of Present Illness:    Erika Hamilton is a 87 y.o. female with past medical history significant for coronary artery disease.  She did have a cardiac catheterization 2015 when she was found to have complete occluded distal portion of the left anterior descending artery.  No intervention has been required at that time.  Her other past medical history is includes essential hypertension, dyslipidemia, diabetes. She is coming today 2 months for follow-up.  Overall she is doing great.  She denies have any chest pain tightness squeezing pressure burning chest no palpitations no dizziness overall doing well she does have a treadmill at home and try to walk on the regular basis on it. Past Medical History:  Diagnosis Date   Bereavement 05/04/2018   one of her sons was killed by a drunk driver in May 1194   Cancer [facility])    breast, tongue, skin cancer   Coronary artery disease involving native coronary artery of native heart with angina pectoris ([place]) 12/25/2014   Overview:  Severe stenosis of distal LAD & s/p NSTEMI in Sep 2015   Diabetes mellitus without complication ([place])    Dyslipidemia 12/25/2014   Overview:  11/06/2014 - TC 165/Trig 117/HDL 53/LDL 89   Essential hypertension 12/25/2014   Gall bladder disease 05/31/2014   Gastroesophageal reflux disease without esophagitis 12/25/2014   Generalized osteoarthritis 12/25/2014   GERD (gastroesophageal reflux disease)    Headache(784.0)    Heart murmur    Hepatic steatosis 12/25/2014   Hx of repair of rotator cuff    Hyperlipidemia associated with type 2 diabetes mellitus ([place]) 12/25/2014   Formatting of this note might be different from the original.  Overview:  11/06/2014 - TC 165/Trig 117/HDL 53/LDL 89  Overview:  11/06/2014 - TC 165/Trig 117/HDL 53/LDL 49 Formatting of this note might be different from the original. 11/06/2014 - TC 165/Trig 117/HDL 53/LDL 89   Hypertension    Insomnia due to medical condition 01/13/2017   Malignant neoplasm of left female breast ([place]) 12/25/2014   MI (myocardial infarction) ([place])    Pancreatitis    Severe obesity (BMI 35.0-39.9) with comorbidity ([place]) 12/25/2014   Type 2 diabetes mellitus with hyperglycemia ([place]) 12/25/2014   Overview:  11/06/2014 HgbA1C was 8.0% (Micral/Creat ratio same date was normal at 7.7 - < 30)   Type 2 diabetes mellitus with hyperglycemia, without long-term current use of insulin ([place]) 12/25/2014   11/06/2014 HgbA1C was 8.0% (Micral/Creat ratio same date was normal at 7.7 - < 30)    Past Surgical History:  Procedure Laterality Date   BREAST SURGERY Left    lumpectomy   CHOLECYSTECTOMY N/A 07/09/2014   Procedure: LAPAROSCOPIC CHOLECYSTECTOMY WITH INTRAOPERATIVE CHOLANGIOGRAM;  Surgeon: Shann Medal, MD;  Location: [place];  Service: General;  Laterality: N/A;   CORONARY ANGIOPLASTY     HERNIA REPAIR     umbilical   SHOULDER SURGERY     TONGUE SURGERY     removal of cancerous mass   TONSILLECTOMY      Current Medications: Current Meds  Medication Sig   aspirin EC 81 MG tablet Take 81 mg by mouth daily.   atorvastatin (LIPITOR)  40 MG tablet Take 40 mg by mouth daily. 1 tab every other day alternating with 1/2 tab every other day.   calcium-vitamin D (OSCAL WITH D) 500-200 MG-UNIT tablet Take 2 tablets by mouth daily with breakfast.   carvedilol (COREG) 12.5 MG tablet Take 25 mg by mouth 2 (two) times daily with a meal.    ferrous sulfate 325 (65 FE) MG EC tablet Take 325 mg by mouth daily with breakfast.   ketoconazole (NIZORAL) 2 % shampoo Apply 1 application topically 2 (two) times a week.   nitroGLYCERIN (NITROSTAT) 0.4 MG SL tablet Place 1 tablet (0.4 mg total) under the  tongue every 5 (five) minutes as needed. (Patient taking differently: Place 0.4 mg under the tongue every 5 (five) minutes as needed for chest pain.)   nystatin cream (MYCOSTATIN) Apply 1 application topically 2 (two) times daily. Unknown strength   Omega-3 Fatty Acids (FISH OIL) 1200 MG CAPS Take 1,200 mg by mouth in the morning and at bedtime.   pantoprazole (PROTONIX) 20 MG tablet Take 20 mg by mouth daily.    quinapril (ACCUPRIL) 40 MG tablet Take 40 mg by mouth daily.    TRESIBA FLEXTOUCH 200 UNIT/ML FlexTouch Pen Inject 40 Units into the skin daily.     Allergies:   Atorvastatin and Metformin hcl   Social History   Socioeconomic History   Marital status: Married    Spouse name: Not on file   Number of children: Not on file   Years of education: Not on file   Highest education level: Not on file  Occupational History   Not on file  Tobacco Use   Smoking status: Never   Smokeless tobacco: Never  Vaping Use   Vaping Use: Never used  Substance and Sexual Activity   Alcohol use: No   Drug use: No   Sexual activity: Not on file  Other Topics Concern   Not on file  Social History Narrative   Not on file   Social Determinants of Health   Financial Resource Strain: Not on file  Food Insecurity: Not on file  Transportation Needs: Not on file  Physical Activity: Not on file  Stress: Not on file  Social Connections: Not on file     Family History: The patient's family history includes Coronary artery disease in her father; Diabetes in her maternal aunt, maternal uncle, and mother. ROS:   Please see the history of present illness.    All 14 point review of systems negative except as described per history of present illness  EKGs/Labs/Other Studies Reviewed:      Recent Labs: No results found for requested labs within last 8760 hours.  Recent Lipid Panel No results found for: CHOL, TRIG, HDL, CHOLHDL, VLDL, LDLCALC, LDLDIRECT  Physical Exam:    VS:  BP 134/72 (BP  Location: Right Arm, Patient Position: Sitting)   Pulse 71   Ht 5' (1.524 m)   Wt 165 lb 9.6 oz (75.1 kg)   SpO2 96%   BMI 32.34 kg/m     Wt Readings from Last 3 Encounters:  08/18/21 165 lb 9.6 oz (75.1 kg)  11/26/20 166 lb (75.3 kg)  05/23/20 174 lb 6.4 oz (79.1 kg)     GEN:  Well nourished, well developed in no acute distress HEENT: Normal NECK: No JVD; No carotid bruits LYMPHATICS: No lymphadenopathy CARDIAC: RRR, no murmurs, no rubs, no gallops RESPIRATORY:  Clear to auscultation without rales, wheezing or rhonchi  ABDOMEN: Soft, non-tender, non-distended MUSCULOSKELETAL:  No edema; No deformity  SKIN: Warm and dry LOWER EXTREMITIES: no swelling NEUROLOGIC:  Alert and oriented x 3 PSYCHIATRIC:  Normal affect   ASSESSMENT:    1. Coronary artery disease involving native coronary artery of native heart with angina pectoris ([place])   2. Essential hypertension   3. Type 2 diabetes mellitus with hyperglycemia, without long-term current use of insulin ([place])   4. Dyslipidemia    PLAN:    In order of problems listed above:  Coronary disease stable from that point review and appropriate medication which I will continue. Essential hypertension blood pressure controlled continue present management. Dyslipidemia I did review her K PN which show me LDL of 82 HDL 51 she is on Lipitor 40 which is high intense statin however the data is from February she scheduled to see her primary care physician within the next month she will have another fasting lipid profile done. Diabetes: She did have hemoglobin A1c and K PN 8.3 this is from February however she tells me that just 2 weeks ago she went to her endocrinologist and she received very good report apparently her hemoglobin A1c went down to 7.4   Medication Adjustments/Labs and Tests Ordered: Current medicines are reviewed at length with the patient today.  Concerns regarding medicines are outlined above.  Orders Placed This Encounter   Procedures   EKG 12-Lead   Medication changes: No orders of the defined types were placed in this encounter.   Signed, Park Liter, MD, [facility] 08/18/2021 2:17 PM    [facility]

## 2021-09-10 DIAGNOSIS — K219 Gastro-esophageal reflux disease without esophagitis: Secondary | ICD-10-CM | POA: Diagnosis not present

## 2021-09-10 DIAGNOSIS — M25571 Pain in right ankle and joints of right foot: Secondary | ICD-10-CM | POA: Diagnosis not present

## 2021-09-10 DIAGNOSIS — Z79899 Other long term (current) drug therapy: Secondary | ICD-10-CM | POA: Diagnosis not present

## 2021-09-10 DIAGNOSIS — E1169 Type 2 diabetes mellitus with other specified complication: Secondary | ICD-10-CM | POA: Diagnosis not present

## 2021-09-10 DIAGNOSIS — I10 Essential (primary) hypertension: Secondary | ICD-10-CM | POA: Diagnosis not present

## 2021-09-10 DIAGNOSIS — I25119 Atherosclerotic heart disease of native coronary artery with unspecified angina pectoris: Secondary | ICD-10-CM | POA: Diagnosis not present

## 2021-09-10 DIAGNOSIS — Z1331 Encounter for screening for depression: Secondary | ICD-10-CM | POA: Diagnosis not present

## 2021-09-10 DIAGNOSIS — I700 Atherosclerosis of aorta: Secondary | ICD-10-CM | POA: Diagnosis not present

## 2021-09-10 DIAGNOSIS — R1033 Periumbilical pain: Secondary | ICD-10-CM | POA: Diagnosis not present

## 2021-09-10 DIAGNOSIS — E785 Hyperlipidemia, unspecified: Secondary | ICD-10-CM | POA: Diagnosis not present

## 2021-12-08 DIAGNOSIS — Z634 Disappearance and death of family member: Secondary | ICD-10-CM | POA: Diagnosis not present

## 2021-12-08 DIAGNOSIS — I10 Essential (primary) hypertension: Secondary | ICD-10-CM | POA: Diagnosis not present

## 2021-12-08 DIAGNOSIS — E11649 Type 2 diabetes mellitus with hypoglycemia without coma: Secondary | ICD-10-CM | POA: Diagnosis not present

## 2021-12-08 DIAGNOSIS — E782 Mixed hyperlipidemia: Secondary | ICD-10-CM | POA: Diagnosis not present

## 2021-12-08 DIAGNOSIS — E1165 Type 2 diabetes mellitus with hyperglycemia: Secondary | ICD-10-CM | POA: Diagnosis not present

## 2021-12-08 DIAGNOSIS — K760 Fatty (change of) liver, not elsewhere classified: Secondary | ICD-10-CM | POA: Diagnosis not present

## 2021-12-08 DIAGNOSIS — I2510 Atherosclerotic heart disease of native coronary artery without angina pectoris: Secondary | ICD-10-CM | POA: Diagnosis not present

## 2021-12-08 DIAGNOSIS — Z6837 Body mass index (BMI) 37.0-37.9, adult: Secondary | ICD-10-CM | POA: Diagnosis not present

## 2022-01-05 DIAGNOSIS — E119 Type 2 diabetes mellitus without complications: Secondary | ICD-10-CM | POA: Diagnosis not present

## 2022-02-19 ENCOUNTER — Ambulatory Visit: Payer: PPO | Admitting: Cardiology

## 2022-02-19 ENCOUNTER — Encounter: Payer: Self-pay | Admitting: Cardiology

## 2022-02-19 VITALS — BP 130/72 | HR 70 | Ht 60.0 in | Wt 163.6 lb

## 2022-02-19 DIAGNOSIS — E785 Hyperlipidemia, unspecified: Secondary | ICD-10-CM

## 2022-02-19 DIAGNOSIS — I25119 Atherosclerotic heart disease of native coronary artery with unspecified angina pectoris: Secondary | ICD-10-CM | POA: Diagnosis not present

## 2022-02-19 DIAGNOSIS — R011 Cardiac murmur, unspecified: Secondary | ICD-10-CM | POA: Diagnosis not present

## 2022-02-19 DIAGNOSIS — E1169 Type 2 diabetes mellitus with other specified complication: Secondary | ICD-10-CM | POA: Diagnosis not present

## 2022-02-19 DIAGNOSIS — I10 Essential (primary) hypertension: Secondary | ICD-10-CM | POA: Diagnosis not present

## 2022-02-19 NOTE — Patient Instructions (Signed)

## 2022-02-19 NOTE — Progress Notes (Signed)
?Cardiology Office Note:   ? ?Date:  02/19/2022  ? ?ID:  Erika Hamilton, DOB [birth year], MRN [id number] ? ?PCP:  Ernestene Kiel, MD  ?Cardiologist:  Jenne Campus, MD   ? ?Referring MD: Ernestene Kiel, MD  ? ?Chief Complaint  ?Patient presents with  ? Follow-up  ?I am doing fine ? ?History of Present Illness:   ? ?Erika Hamilton is a 88 y.o. female   with past medical history significant for coronary artery disease.  She did have a cardiac catheterization 2015 when she was found to have complete occluded distal portion of the left anterior descending artery.  No intervention has been required at that time.  Her other past medical history is includes essential hypertension, dyslipidemia, diabetes. ?Comes today 2 months for follow-up.  Overall she is doing fine.  She denies of any chest pain tightness squeezing pressure burning chest and after admit for somebody who is her age she is looking quite well.  The only complaint she has some shortness of breath. ? ?Past Medical History:  ?Diagnosis Date  ? Bereavement 05/04/2018  ? one of her sons was killed by a drunk driver in May 8242  ? Cancer [facility])   ? breast, tongue, skin cancer  ? Coronary artery disease involving native coronary artery of native heart with angina pectoris ([place]) 12/25/2014  ? Overview:  Severe stenosis of distal LAD & s/p NSTEMI in Sep 2015  ? Diabetes mellitus without complication ([place])   ? Dyslipidemia 12/25/2014  ? Overview:  11/06/2014 - TC 165/Trig 117/HDL 53/LDL 89  ? Essential hypertension 12/25/2014  ? Gall bladder disease 05/31/2014  ? Gastroesophageal reflux disease without esophagitis 12/25/2014  ? Generalized osteoarthritis 12/25/2014  ? GERD (gastroesophageal reflux disease)   ? Headache(784.0)   ? Heart murmur   ? Hepatic steatosis 12/25/2014  ? Hx of repair of rotator cuff   ? Hyperlipidemia associated with type 2 diabetes mellitus ([place]) 12/25/2014  ? Formatting of this note might be different from the original.  Overview:  11/06/2014 - TC 165/Trig 117/HDL 53/LDL 89  Overview:  11/06/2014 - TC 165/Trig 117/HDL 53/LDL 89 Formatting of this note might be different from the original. 11/06/2014 - TC 165/Trig 117/HDL 53/LDL 89  ? Hypertension   ? Insomnia due to medical condition 01/13/2017  ? Malignant neoplasm of left female breast ([place]) 12/25/2014  ? MI (myocardial infarction) ([place])   ? Pancreatitis   ? Severe obesity (BMI 35.0-39.9) with comorbidity ([place]) 12/25/2014  ? Type 2 diabetes mellitus with hyperglycemia ([place]) 12/25/2014  ? Overview:  11/06/2014 HgbA1C was 8.0% (Micral/Creat ratio same date was normal at 7.7 - < 30)  ? Type 2 diabetes mellitus with hyperglycemia, without long-term current use of insulin ([place]) 12/25/2014  ? 11/06/2014 HgbA1C was 8.0% (Micral/Creat ratio same date was normal at 7.7 - < 30)  ? ? ?Past Surgical History:  ?Procedure Laterality Date  ? BREAST SURGERY Left   ? lumpectomy  ? CHOLECYSTECTOMY N/A 07/09/2014  ? Procedure: LAPAROSCOPIC CHOLECYSTECTOMY WITH INTRAOPERATIVE CHOLANGIOGRAM;  Surgeon: Shann Medal, MD;  Location: [facility];  Service: General;  Laterality: N/A;  ? CORONARY ANGIOPLASTY    ? HERNIA REPAIR    ? umbilical  ? SHOULDER SURGERY    ? TONGUE SURGERY    ? removal of cancerous mass  ? TONSILLECTOMY    ? ? ?Current Medications: ?Current Meds  ?Medication Sig  ? aspirin EC 81 MG tablet Take 81 mg by mouth daily.  ?  atorvastatin (LIPITOR) 40 MG tablet Take 40 mg by mouth daily. 1 tab every other day alternating with 1/2 tab every other day.  ? calcium-vitamin D (OSCAL WITH D) 500-200 MG-UNIT tablet Take 2 tablets by mouth daily with breakfast.  ? carvedilol (COREG) 12.5 MG tablet Take 25 mg by mouth 2 (two) times daily with a meal.   ? ferrous sulfate 325 (65 FE) MG EC tablet Take 325 mg by mouth daily with breakfast.  ? ketoconazole (NIZORAL) 2 % shampoo Apply 1 application topically 2 (two) times a week.  ? nitroGLYCERIN (NITROSTAT) 0.4 MG SL tablet Place 1 tablet (0.4 mg total) under the  tongue every 5 (five) minutes as needed. (Patient taking differently: Place 0.4 mg under the tongue every 5 (five) minutes as needed for chest pain.)  ? nystatin cream (MYCOSTATIN) Apply 1 application topically 2 (two) times daily. Unknown strength  ? Omega-3 Fatty Acids (FISH OIL) 1200 MG CAPS Take 1,200 mg by mouth in the morning and at bedtime.  ? pantoprazole (PROTONIX) 20 MG tablet Take 20 mg by mouth daily.   ? Probiotic Product (PROBIOTIC DAILY PO) Take 1 capsule by mouth daily after breakfast.  ? Probiotic Product (PROBIOTIC DAILY PO) Take 1 tablet by mouth daily after breakfast.  ? quinapril (ACCUPRIL) 40 MG tablet Take 40 mg by mouth daily.   ? TRESIBA FLEXTOUCH 200 UNIT/ML FlexTouch Pen Inject 40 Units into the skin daily.  ?  ? ?Allergies:   Atorvastatin and Metformin hcl  ? ?Social History  ? ?Socioeconomic History  ? Marital status: Married  ?  Spouse name: Not on file  ? Number of children: Not on file  ? Years of education: Not on file  ? Highest education level: Not on file  ?Occupational History  ? Not on file  ?Tobacco Use  ? Smoking status: Never  ? Smokeless tobacco: Never  ?Vaping Use  ? Vaping Use: Never used  ?Substance and Sexual Activity  ? Alcohol use: No  ? Drug use: No  ? Sexual activity: Not on file  ?Other Topics Concern  ? Not on file  ?Social History Narrative  ? Not on file  ? ?Social Determinants of Health  ? ?Financial Resource Strain: Not on file  ?Food Insecurity: Not on file  ?Transportation Needs: Not on file  ?Physical Activity: Not on file  ?Stress: Not on file  ?Social Connections: Not on file  ?  ? ?Family History: ?The patient's family history includes Coronary artery disease in her father; Diabetes in her maternal aunt, maternal uncle, and mother. ?ROS:   ?Please see the history of present illness.    ?All 14 point review of systems negative except as described per history of present illness ? ?EKGs/Labs/Other Studies Reviewed:   ? ? ? ?Recent Labs: ?No results found for  requested labs within last 8760 hours.  ?Recent Lipid Panel ?No results found for: CHOL, TRIG, HDL, CHOLHDL, VLDL, LDLCALC, LDLDIRECT ? ?Physical Exam:   ? ?VS:  BP 130/72 (BP Location: Left Arm, Patient Position: Sitting)   Pulse 70   Ht 5' (1.524 m)   Wt 163 lb 9.6 oz (74.2 kg)   SpO2 97%   BMI 31.95 kg/m?    ? ?Wt Readings from Last 3 Encounters:  ?02/19/22 163 lb 9.6 oz (74.2 kg)  ?08/18/21 165 lb 9.6 oz (75.1 kg)  ?11/26/20 166 lb (75.3 kg)  ?  ? ?GEN:  Well nourished, well developed in no acute distress ?HEENT: Normal ?NECK:  No JVD; No carotid bruits ?LYMPHATICS: No lymphadenopathy ?CARDIAC: RRR, n holosystolic murmur grade 2/6 best heard left border of the sternum, no rubs, no gallops ?RESPIRATORY:  Clear to auscultation without rales, wheezing or rhonchi  ?ABDOMEN: Soft, non-tender, non-distended ?MUSCULOSKELETAL:  No edema; No deformity  ?SKIN: Warm and dry ?LOWER EXTREMITIES: no swelling ?NEUROLOGIC:  Alert and oriented x 3 ?PSYCHIATRIC:  Normal affect  ? ?ASSESSMENT:   ? ?1. Coronary artery disease involving native coronary artery of native heart with angina pectoris ([place])   ?2. Essential hypertension   ?3. Hyperlipidemia associated with type 2 diabetes mellitus ([place])   ?4. Dyslipidemia   ?5. Heart murmur   ? ?PLAN:   ? ?In order of problems listed above: ? ?Coronary disease status known completely occluded distal LAD.  She is asymptomatic, she is on antiplatelet therapy which I will continue. ?Heart murmur.  Mitral regurgitation I will ask her to have another echocardiogram to to check degree of mitral regurgitation however clinically she seems to be stable ?Hyperlipidemia, I did review K PN which show me her LDL of 98 HDL 46.  She does not want to take any statin.  Previously she had intolerance to statin. ?Type 2 diabetes followed by internal medicine team ? ? ?Medication Adjustments/Labs and Tests Ordered: ?Current medicines are reviewed at length with the patient today.  Concerns regarding  medicines are outlined above.  ?No orders of the defined types were placed in this encounter. ? ?Medication changes: No orders of the defined types were placed in this encounter. ? ? ?Signed, ?Park Liter, MD, F

## 2022-02-19 NOTE — Addendum Note (Signed)
Addended by: Edwyna Shell I on: 02/19/2022 03:00 PM ? ? Modules accepted: Orders ? ?

## 2022-02-24 ENCOUNTER — Other Ambulatory Visit: Payer: PPO

## 2022-03-04 ENCOUNTER — Other Ambulatory Visit: Payer: PPO

## 2022-03-04 ENCOUNTER — Ambulatory Visit (INDEPENDENT_AMBULATORY_CARE_PROVIDER_SITE_OTHER): Payer: PPO

## 2022-03-04 DIAGNOSIS — R011 Cardiac murmur, unspecified: Secondary | ICD-10-CM

## 2022-03-04 DIAGNOSIS — I25119 Atherosclerotic heart disease of native coronary artery with unspecified angina pectoris: Secondary | ICD-10-CM

## 2022-03-04 DIAGNOSIS — I10 Essential (primary) hypertension: Secondary | ICD-10-CM | POA: Diagnosis not present

## 2022-03-04 DIAGNOSIS — E785 Hyperlipidemia, unspecified: Secondary | ICD-10-CM | POA: Diagnosis not present

## 2022-03-04 DIAGNOSIS — E1169 Type 2 diabetes mellitus with other specified complication: Secondary | ICD-10-CM | POA: Diagnosis not present

## 2022-03-04 LAB — ECHOCARDIOGRAM COMPLETE
AR max vel: 1.09 cm2
AV Area VTI: 1.18 cm2
AV Area mean vel: 1.13 cm2
AV Mean grad: 9 mmHg
AV Peak grad: 16.3 mmHg
Ao pk vel: 2.02 m/s
Area-P 1/2: 3.17 cm2
MV M vel: 4.59 m/s
MV Peak grad: 84.3 mmHg
MV VTI: 1.39 cm2
S' Lateral: 2.5 cm

## 2022-03-12 DIAGNOSIS — I25119 Atherosclerotic heart disease of native coronary artery with unspecified angina pectoris: Secondary | ICD-10-CM | POA: Diagnosis not present

## 2022-03-12 DIAGNOSIS — E785 Hyperlipidemia, unspecified: Secondary | ICD-10-CM | POA: Diagnosis not present

## 2022-03-12 DIAGNOSIS — Z79899 Other long term (current) drug therapy: Secondary | ICD-10-CM | POA: Diagnosis not present

## 2022-03-12 DIAGNOSIS — I10 Essential (primary) hypertension: Secondary | ICD-10-CM | POA: Diagnosis not present

## 2022-03-12 DIAGNOSIS — K219 Gastro-esophageal reflux disease without esophagitis: Secondary | ICD-10-CM | POA: Diagnosis not present

## 2022-03-12 DIAGNOSIS — Z6831 Body mass index (BMI) 31.0-31.9, adult: Secondary | ICD-10-CM | POA: Diagnosis not present

## 2022-03-12 DIAGNOSIS — E1169 Type 2 diabetes mellitus with other specified complication: Secondary | ICD-10-CM | POA: Diagnosis not present

## 2022-03-12 DIAGNOSIS — I700 Atherosclerosis of aorta: Secondary | ICD-10-CM | POA: Diagnosis not present

## 2022-03-12 DIAGNOSIS — R229 Localized swelling, mass and lump, unspecified: Secondary | ICD-10-CM | POA: Diagnosis not present

## 2022-03-17 DIAGNOSIS — K760 Fatty (change of) liver, not elsewhere classified: Secondary | ICD-10-CM | POA: Diagnosis not present

## 2022-03-17 DIAGNOSIS — Z6831 Body mass index (BMI) 31.0-31.9, adult: Secondary | ICD-10-CM | POA: Diagnosis not present

## 2022-03-17 DIAGNOSIS — I2510 Atherosclerotic heart disease of native coronary artery without angina pectoris: Secondary | ICD-10-CM | POA: Diagnosis not present

## 2022-03-17 DIAGNOSIS — E7800 Pure hypercholesterolemia, unspecified: Secondary | ICD-10-CM | POA: Diagnosis not present

## 2022-03-17 DIAGNOSIS — E1165 Type 2 diabetes mellitus with hyperglycemia: Secondary | ICD-10-CM | POA: Diagnosis not present

## 2022-03-17 DIAGNOSIS — E11649 Type 2 diabetes mellitus with hypoglycemia without coma: Secondary | ICD-10-CM | POA: Diagnosis not present

## 2022-03-17 DIAGNOSIS — I10 Essential (primary) hypertension: Secondary | ICD-10-CM | POA: Diagnosis not present

## 2022-03-26 DIAGNOSIS — R1033 Periumbilical pain: Secondary | ICD-10-CM | POA: Diagnosis not present

## 2022-03-26 DIAGNOSIS — Z1231 Encounter for screening mammogram for malignant neoplasm of breast: Secondary | ICD-10-CM | POA: Diagnosis not present

## 2022-04-26 ENCOUNTER — Other Ambulatory Visit: Payer: Self-pay | Admitting: Cardiology

## 2022-04-27 ENCOUNTER — Telehealth: Payer: Self-pay | Admitting: Cardiology

## 2022-04-27 ENCOUNTER — Other Ambulatory Visit: Payer: Self-pay

## 2022-04-27 MED ORDER — NITROGLYCERIN 0.4 MG SL SUBL: 0.4000 mg | SUBLINGUAL_TABLET | SUBLINGUAL | 3 refills | Status: DC | PRN

## 2022-04-27 NOTE — Telephone Encounter (Signed)
*  STAT* If patient is at the pharmacy, call can be transferred to refill team.   1. Which medications need to be refilled? (please list name of each medication and dose if known)  nitroGLYCERIN (NITROSTAT) 0.4 MG SL tablet  2. Which pharmacy/location (including street and city if local pharmacy) is medication to be sent to? [facility] [place] [address]  3. Do they need a 30 day or 90 day supply? 30 with refills

## 2022-04-27 NOTE — Telephone Encounter (Signed)
Rx refill sent to pharmacy. 

## 2022-04-27 NOTE — Telephone Encounter (Signed)
Called patient and reviewed how to take nitroglycerin with her. Patient stated that she understood. Medication was refilled via Epic and sent to the patient's pharmacy. Patient had no further questions at this time.

## 2022-06-09 DIAGNOSIS — I25119 Atherosclerotic heart disease of native coronary artery with unspecified angina pectoris: Secondary | ICD-10-CM | POA: Diagnosis not present

## 2022-06-09 DIAGNOSIS — E785 Hyperlipidemia, unspecified: Secondary | ICD-10-CM | POA: Diagnosis not present

## 2022-06-09 DIAGNOSIS — E1169 Type 2 diabetes mellitus with other specified complication: Secondary | ICD-10-CM | POA: Diagnosis not present

## 2022-06-09 DIAGNOSIS — I10 Essential (primary) hypertension: Secondary | ICD-10-CM | POA: Diagnosis not present

## 2022-06-09 DIAGNOSIS — Z0000 Encounter for general adult medical examination without abnormal findings: Secondary | ICD-10-CM | POA: Diagnosis not present

## 2022-06-09 DIAGNOSIS — K219 Gastro-esophageal reflux disease without esophagitis: Secondary | ICD-10-CM | POA: Diagnosis not present

## 2022-06-09 DIAGNOSIS — Z79899 Other long term (current) drug therapy: Secondary | ICD-10-CM | POA: Diagnosis not present

## 2022-06-09 DIAGNOSIS — I700 Atherosclerosis of aorta: Secondary | ICD-10-CM | POA: Diagnosis not present

## 2022-06-09 DIAGNOSIS — M2550 Pain in unspecified joint: Secondary | ICD-10-CM | POA: Diagnosis not present

## 2022-06-09 DIAGNOSIS — Z1331 Encounter for screening for depression: Secondary | ICD-10-CM | POA: Diagnosis not present

## 2022-06-09 DIAGNOSIS — Z6832 Body mass index (BMI) 32.0-32.9, adult: Secondary | ICD-10-CM | POA: Diagnosis not present

## 2022-06-22 DIAGNOSIS — E1169 Type 2 diabetes mellitus with other specified complication: Secondary | ICD-10-CM | POA: Diagnosis not present

## 2022-06-22 DIAGNOSIS — I10 Essential (primary) hypertension: Secondary | ICD-10-CM | POA: Diagnosis not present

## 2022-06-22 DIAGNOSIS — I25119 Atherosclerotic heart disease of native coronary artery with unspecified angina pectoris: Secondary | ICD-10-CM | POA: Diagnosis not present

## 2022-06-25 DIAGNOSIS — E1165 Type 2 diabetes mellitus with hyperglycemia: Secondary | ICD-10-CM | POA: Diagnosis not present

## 2022-06-25 DIAGNOSIS — K760 Fatty (change of) liver, not elsewhere classified: Secondary | ICD-10-CM | POA: Diagnosis not present

## 2022-06-25 DIAGNOSIS — E669 Obesity, unspecified: Secondary | ICD-10-CM | POA: Diagnosis not present

## 2022-06-25 DIAGNOSIS — I10 Essential (primary) hypertension: Secondary | ICD-10-CM | POA: Diagnosis not present

## 2022-06-25 DIAGNOSIS — I2510 Atherosclerotic heart disease of native coronary artery without angina pectoris: Secondary | ICD-10-CM | POA: Diagnosis not present

## 2022-06-25 DIAGNOSIS — E782 Mixed hyperlipidemia: Secondary | ICD-10-CM | POA: Diagnosis not present

## 2022-06-25 DIAGNOSIS — E1140 Type 2 diabetes mellitus with diabetic neuropathy, unspecified: Secondary | ICD-10-CM | POA: Diagnosis not present

## 2022-07-13 DIAGNOSIS — Z6832 Body mass index (BMI) 32.0-32.9, adult: Secondary | ICD-10-CM | POA: Diagnosis not present

## 2022-07-13 DIAGNOSIS — I10 Essential (primary) hypertension: Secondary | ICD-10-CM | POA: Diagnosis not present

## 2022-08-06 DIAGNOSIS — R5383 Other fatigue: Secondary | ICD-10-CM | POA: Diagnosis not present

## 2022-08-06 DIAGNOSIS — M25531 Pain in right wrist: Secondary | ICD-10-CM | POA: Diagnosis not present

## 2022-08-06 DIAGNOSIS — J019 Acute sinusitis, unspecified: Secondary | ICD-10-CM | POA: Diagnosis not present

## 2022-08-06 DIAGNOSIS — R413 Other amnesia: Secondary | ICD-10-CM | POA: Diagnosis not present

## 2022-08-06 DIAGNOSIS — R195 Other fecal abnormalities: Secondary | ICD-10-CM | POA: Diagnosis not present

## 2022-08-06 DIAGNOSIS — M2550 Pain in unspecified joint: Secondary | ICD-10-CM | POA: Diagnosis not present

## 2022-08-06 DIAGNOSIS — E1169 Type 2 diabetes mellitus with other specified complication: Secondary | ICD-10-CM | POA: Diagnosis not present

## 2022-08-11 ENCOUNTER — Ambulatory Visit: Payer: PPO | Admitting: Cardiology

## 2022-09-10 DIAGNOSIS — Z23 Encounter for immunization: Secondary | ICD-10-CM | POA: Diagnosis not present

## 2022-09-10 DIAGNOSIS — M79642 Pain in left hand: Secondary | ICD-10-CM | POA: Diagnosis not present

## 2022-09-10 DIAGNOSIS — M419 Scoliosis, unspecified: Secondary | ICD-10-CM | POA: Diagnosis not present

## 2022-09-10 DIAGNOSIS — M1990 Unspecified osteoarthritis, unspecified site: Secondary | ICD-10-CM | POA: Diagnosis not present

## 2022-09-10 DIAGNOSIS — R768 Other specified abnormal immunological findings in serum: Secondary | ICD-10-CM | POA: Diagnosis not present

## 2022-09-10 DIAGNOSIS — M79641 Pain in right hand: Secondary | ICD-10-CM | POA: Diagnosis not present

## 2022-09-10 DIAGNOSIS — Z853 Personal history of malignant neoplasm of breast: Secondary | ICD-10-CM | POA: Diagnosis not present

## 2022-09-10 DIAGNOSIS — E669 Obesity, unspecified: Secondary | ICD-10-CM | POA: Diagnosis not present

## 2022-09-10 DIAGNOSIS — M25561 Pain in right knee: Secondary | ICD-10-CM | POA: Diagnosis not present

## 2022-09-10 DIAGNOSIS — M25511 Pain in right shoulder: Secondary | ICD-10-CM | POA: Diagnosis not present

## 2022-09-10 DIAGNOSIS — M25562 Pain in left knee: Secondary | ICD-10-CM | POA: Diagnosis not present

## 2022-09-10 DIAGNOSIS — M25512 Pain in left shoulder: Secondary | ICD-10-CM | POA: Diagnosis not present

## 2022-09-10 DIAGNOSIS — E119 Type 2 diabetes mellitus without complications: Secondary | ICD-10-CM | POA: Diagnosis not present

## 2022-09-10 DIAGNOSIS — R700 Elevated erythrocyte sedimentation rate: Secondary | ICD-10-CM | POA: Diagnosis not present

## 2022-09-22 DIAGNOSIS — I10 Essential (primary) hypertension: Secondary | ICD-10-CM | POA: Diagnosis not present

## 2022-09-22 DIAGNOSIS — E1169 Type 2 diabetes mellitus with other specified complication: Secondary | ICD-10-CM | POA: Diagnosis not present

## 2022-09-22 DIAGNOSIS — I25119 Atherosclerotic heart disease of native coronary artery with unspecified angina pectoris: Secondary | ICD-10-CM | POA: Diagnosis not present

## 2022-09-30 DIAGNOSIS — Z853 Personal history of malignant neoplasm of breast: Secondary | ICD-10-CM | POA: Diagnosis not present

## 2022-09-30 DIAGNOSIS — M1990 Unspecified osteoarthritis, unspecified site: Secondary | ICD-10-CM | POA: Diagnosis not present

## 2022-09-30 DIAGNOSIS — M419 Scoliosis, unspecified: Secondary | ICD-10-CM | POA: Diagnosis not present

## 2022-09-30 DIAGNOSIS — Z23 Encounter for immunization: Secondary | ICD-10-CM | POA: Diagnosis not present

## 2022-09-30 DIAGNOSIS — E119 Type 2 diabetes mellitus without complications: Secondary | ICD-10-CM | POA: Diagnosis not present

## 2022-09-30 DIAGNOSIS — E669 Obesity, unspecified: Secondary | ICD-10-CM | POA: Diagnosis not present

## 2022-09-30 DIAGNOSIS — R700 Elevated erythrocyte sedimentation rate: Secondary | ICD-10-CM | POA: Diagnosis not present

## 2022-09-30 DIAGNOSIS — R768 Other specified abnormal immunological findings in serum: Secondary | ICD-10-CM | POA: Diagnosis not present

## 2022-11-26 DIAGNOSIS — M1990 Unspecified osteoarthritis, unspecified site: Secondary | ICD-10-CM | POA: Diagnosis not present

## 2022-11-26 DIAGNOSIS — E119 Type 2 diabetes mellitus without complications: Secondary | ICD-10-CM | POA: Diagnosis not present

## 2022-11-26 DIAGNOSIS — R700 Elevated erythrocyte sedimentation rate: Secondary | ICD-10-CM | POA: Diagnosis not present

## 2022-11-26 DIAGNOSIS — Z853 Personal history of malignant neoplasm of breast: Secondary | ICD-10-CM | POA: Diagnosis not present

## 2022-11-26 DIAGNOSIS — M0579 Rheumatoid arthritis with rheumatoid factor of multiple sites without organ or systems involvement: Secondary | ICD-10-CM | POA: Diagnosis not present

## 2022-11-26 DIAGNOSIS — M419 Scoliosis, unspecified: Secondary | ICD-10-CM | POA: Diagnosis not present

## 2022-11-26 DIAGNOSIS — E669 Obesity, unspecified: Secondary | ICD-10-CM | POA: Diagnosis not present

## 2022-12-04 DIAGNOSIS — G8929 Other chronic pain: Secondary | ICD-10-CM | POA: Diagnosis not present

## 2022-12-04 DIAGNOSIS — E119 Type 2 diabetes mellitus without complications: Secondary | ICD-10-CM | POA: Diagnosis not present

## 2022-12-04 DIAGNOSIS — Z794 Long term (current) use of insulin: Secondary | ICD-10-CM | POA: Diagnosis not present

## 2022-12-04 DIAGNOSIS — E669 Obesity, unspecified: Secondary | ICD-10-CM | POA: Diagnosis not present

## 2022-12-04 DIAGNOSIS — Z85810 Personal history of malignant neoplasm of tongue: Secondary | ICD-10-CM | POA: Diagnosis not present

## 2022-12-04 DIAGNOSIS — Z7982 Long term (current) use of aspirin: Secondary | ICD-10-CM | POA: Diagnosis not present

## 2022-12-04 DIAGNOSIS — M150 Primary generalized (osteo)arthritis: Secondary | ICD-10-CM | POA: Diagnosis not present

## 2022-12-04 DIAGNOSIS — B91 Sequelae of poliomyelitis: Secondary | ICD-10-CM | POA: Diagnosis not present

## 2022-12-04 DIAGNOSIS — M7552 Bursitis of left shoulder: Secondary | ICD-10-CM | POA: Diagnosis not present

## 2022-12-04 DIAGNOSIS — M4152 Other secondary scoliosis, cervical region: Secondary | ICD-10-CM | POA: Diagnosis not present

## 2022-12-04 DIAGNOSIS — Z6833 Body mass index (BMI) 33.0-33.9, adult: Secondary | ICD-10-CM | POA: Diagnosis not present

## 2022-12-04 DIAGNOSIS — Z853 Personal history of malignant neoplasm of breast: Secondary | ICD-10-CM | POA: Diagnosis not present

## 2022-12-04 DIAGNOSIS — R768 Other specified abnormal immunological findings in serum: Secondary | ICD-10-CM | POA: Diagnosis not present

## 2022-12-14 DIAGNOSIS — K219 Gastro-esophageal reflux disease without esophagitis: Secondary | ICD-10-CM | POA: Diagnosis not present

## 2022-12-14 DIAGNOSIS — Z794 Long term (current) use of insulin: Secondary | ICD-10-CM | POA: Diagnosis not present

## 2022-12-14 DIAGNOSIS — I25119 Atherosclerotic heart disease of native coronary artery with unspecified angina pectoris: Secondary | ICD-10-CM | POA: Diagnosis not present

## 2022-12-14 DIAGNOSIS — Z79899 Other long term (current) drug therapy: Secondary | ICD-10-CM | POA: Diagnosis not present

## 2022-12-14 DIAGNOSIS — I10 Essential (primary) hypertension: Secondary | ICD-10-CM | POA: Diagnosis not present

## 2022-12-14 DIAGNOSIS — Z6832 Body mass index (BMI) 32.0-32.9, adult: Secondary | ICD-10-CM | POA: Diagnosis not present

## 2022-12-14 DIAGNOSIS — E785 Hyperlipidemia, unspecified: Secondary | ICD-10-CM | POA: Diagnosis not present

## 2022-12-14 DIAGNOSIS — E1169 Type 2 diabetes mellitus with other specified complication: Secondary | ICD-10-CM | POA: Diagnosis not present

## 2022-12-14 DIAGNOSIS — I700 Atherosclerosis of aorta: Secondary | ICD-10-CM | POA: Diagnosis not present

## 2022-12-18 DIAGNOSIS — E669 Obesity, unspecified: Secondary | ICD-10-CM | POA: Diagnosis not present

## 2022-12-18 DIAGNOSIS — Z853 Personal history of malignant neoplasm of breast: Secondary | ICD-10-CM | POA: Diagnosis not present

## 2022-12-18 DIAGNOSIS — R768 Other specified abnormal immunological findings in serum: Secondary | ICD-10-CM | POA: Diagnosis not present

## 2022-12-18 DIAGNOSIS — Z85810 Personal history of malignant neoplasm of tongue: Secondary | ICD-10-CM | POA: Diagnosis not present

## 2022-12-18 DIAGNOSIS — Z6833 Body mass index (BMI) 33.0-33.9, adult: Secondary | ICD-10-CM | POA: Diagnosis not present

## 2022-12-18 DIAGNOSIS — G8929 Other chronic pain: Secondary | ICD-10-CM | POA: Diagnosis not present

## 2022-12-18 DIAGNOSIS — B91 Sequelae of poliomyelitis: Secondary | ICD-10-CM | POA: Diagnosis not present

## 2022-12-18 DIAGNOSIS — M150 Primary generalized (osteo)arthritis: Secondary | ICD-10-CM | POA: Diagnosis not present

## 2022-12-18 DIAGNOSIS — Z7982 Long term (current) use of aspirin: Secondary | ICD-10-CM | POA: Diagnosis not present

## 2022-12-18 DIAGNOSIS — M4152 Other secondary scoliosis, cervical region: Secondary | ICD-10-CM | POA: Diagnosis not present

## 2022-12-18 DIAGNOSIS — M7552 Bursitis of left shoulder: Secondary | ICD-10-CM | POA: Diagnosis not present

## 2022-12-18 DIAGNOSIS — E119 Type 2 diabetes mellitus without complications: Secondary | ICD-10-CM | POA: Diagnosis not present

## 2022-12-18 DIAGNOSIS — Z794 Long term (current) use of insulin: Secondary | ICD-10-CM | POA: Diagnosis not present

## 2022-12-24 DIAGNOSIS — R768 Other specified abnormal immunological findings in serum: Secondary | ICD-10-CM | POA: Diagnosis not present

## 2022-12-24 DIAGNOSIS — Z7982 Long term (current) use of aspirin: Secondary | ICD-10-CM | POA: Diagnosis not present

## 2022-12-24 DIAGNOSIS — B91 Sequelae of poliomyelitis: Secondary | ICD-10-CM | POA: Diagnosis not present

## 2022-12-24 DIAGNOSIS — Z794 Long term (current) use of insulin: Secondary | ICD-10-CM | POA: Diagnosis not present

## 2022-12-24 DIAGNOSIS — Z6833 Body mass index (BMI) 33.0-33.9, adult: Secondary | ICD-10-CM | POA: Diagnosis not present

## 2022-12-24 DIAGNOSIS — M4152 Other secondary scoliosis, cervical region: Secondary | ICD-10-CM | POA: Diagnosis not present

## 2022-12-24 DIAGNOSIS — Z853 Personal history of malignant neoplasm of breast: Secondary | ICD-10-CM | POA: Diagnosis not present

## 2022-12-24 DIAGNOSIS — M7552 Bursitis of left shoulder: Secondary | ICD-10-CM | POA: Diagnosis not present

## 2022-12-24 DIAGNOSIS — E669 Obesity, unspecified: Secondary | ICD-10-CM | POA: Diagnosis not present

## 2022-12-24 DIAGNOSIS — E119 Type 2 diabetes mellitus without complications: Secondary | ICD-10-CM | POA: Diagnosis not present

## 2022-12-24 DIAGNOSIS — Z85810 Personal history of malignant neoplasm of tongue: Secondary | ICD-10-CM | POA: Diagnosis not present

## 2022-12-24 DIAGNOSIS — M150 Primary generalized (osteo)arthritis: Secondary | ICD-10-CM | POA: Diagnosis not present

## 2022-12-24 DIAGNOSIS — G8929 Other chronic pain: Secondary | ICD-10-CM | POA: Diagnosis not present

## 2023-01-01 DIAGNOSIS — E1140 Type 2 diabetes mellitus with diabetic neuropathy, unspecified: Secondary | ICD-10-CM | POA: Diagnosis not present

## 2023-01-01 DIAGNOSIS — E782 Mixed hyperlipidemia: Secondary | ICD-10-CM | POA: Diagnosis not present

## 2023-01-01 DIAGNOSIS — I2510 Atherosclerotic heart disease of native coronary artery without angina pectoris: Secondary | ICD-10-CM | POA: Diagnosis not present

## 2023-01-01 DIAGNOSIS — I10 Essential (primary) hypertension: Secondary | ICD-10-CM | POA: Diagnosis not present

## 2023-01-01 DIAGNOSIS — E669 Obesity, unspecified: Secondary | ICD-10-CM | POA: Diagnosis not present

## 2023-01-01 DIAGNOSIS — K760 Fatty (change of) liver, not elsewhere classified: Secondary | ICD-10-CM | POA: Diagnosis not present

## 2023-01-01 DIAGNOSIS — E1165 Type 2 diabetes mellitus with hyperglycemia: Secondary | ICD-10-CM | POA: Diagnosis not present

## 2023-01-04 DIAGNOSIS — Z85810 Personal history of malignant neoplasm of tongue: Secondary | ICD-10-CM | POA: Diagnosis not present

## 2023-01-04 DIAGNOSIS — M4152 Other secondary scoliosis, cervical region: Secondary | ICD-10-CM | POA: Diagnosis not present

## 2023-01-04 DIAGNOSIS — Z6833 Body mass index (BMI) 33.0-33.9, adult: Secondary | ICD-10-CM | POA: Diagnosis not present

## 2023-01-04 DIAGNOSIS — E669 Obesity, unspecified: Secondary | ICD-10-CM | POA: Diagnosis not present

## 2023-01-04 DIAGNOSIS — Z853 Personal history of malignant neoplasm of breast: Secondary | ICD-10-CM | POA: Diagnosis not present

## 2023-01-04 DIAGNOSIS — M150 Primary generalized (osteo)arthritis: Secondary | ICD-10-CM | POA: Diagnosis not present

## 2023-01-04 DIAGNOSIS — M7552 Bursitis of left shoulder: Secondary | ICD-10-CM | POA: Diagnosis not present

## 2023-01-04 DIAGNOSIS — R768 Other specified abnormal immunological findings in serum: Secondary | ICD-10-CM | POA: Diagnosis not present

## 2023-01-04 DIAGNOSIS — G8929 Other chronic pain: Secondary | ICD-10-CM | POA: Diagnosis not present

## 2023-01-04 DIAGNOSIS — Z7982 Long term (current) use of aspirin: Secondary | ICD-10-CM | POA: Diagnosis not present

## 2023-01-04 DIAGNOSIS — Z794 Long term (current) use of insulin: Secondary | ICD-10-CM | POA: Diagnosis not present

## 2023-01-04 DIAGNOSIS — E119 Type 2 diabetes mellitus without complications: Secondary | ICD-10-CM | POA: Diagnosis not present

## 2023-01-04 DIAGNOSIS — B91 Sequelae of poliomyelitis: Secondary | ICD-10-CM | POA: Diagnosis not present

## 2023-02-17 DIAGNOSIS — H524 Presbyopia: Secondary | ICD-10-CM | POA: Diagnosis not present

## 2023-02-17 DIAGNOSIS — E119 Type 2 diabetes mellitus without complications: Secondary | ICD-10-CM | POA: Diagnosis not present

## 2023-04-22 DIAGNOSIS — Z1231 Encounter for screening mammogram for malignant neoplasm of breast: Secondary | ICD-10-CM | POA: Diagnosis not present

## 2023-04-27 DIAGNOSIS — L300 Nummular dermatitis: Secondary | ICD-10-CM | POA: Diagnosis not present

## 2023-04-29 ENCOUNTER — Other Ambulatory Visit: Payer: Self-pay

## 2023-04-29 MED ORDER — NITROGLYCERIN 0.4 MG SL SUBL: 0.4000 mg | SUBLINGUAL_TABLET | SUBLINGUAL | 0 refills | Status: AC | PRN

## 2023-04-30 ENCOUNTER — Ambulatory Visit: Payer: PPO | Admitting: Cardiology

## 2023-05-25 ENCOUNTER — Encounter: Payer: Self-pay | Admitting: Cardiology

## 2023-05-25 ENCOUNTER — Ambulatory Visit: Payer: PPO | Attending: Cardiology | Admitting: Cardiology

## 2023-05-25 VITALS — BP 122/74 | HR 67 | Ht 60.0 in | Wt 174.2 lb

## 2023-05-25 DIAGNOSIS — E1169 Type 2 diabetes mellitus with other specified complication: Secondary | ICD-10-CM

## 2023-05-25 DIAGNOSIS — I10 Essential (primary) hypertension: Secondary | ICD-10-CM

## 2023-05-25 DIAGNOSIS — E785 Hyperlipidemia, unspecified: Secondary | ICD-10-CM | POA: Diagnosis not present

## 2023-05-25 DIAGNOSIS — I25119 Atherosclerotic heart disease of native coronary artery with unspecified angina pectoris: Secondary | ICD-10-CM

## 2023-05-25 DIAGNOSIS — Z794 Long term (current) use of insulin: Secondary | ICD-10-CM

## 2023-05-25 DIAGNOSIS — R0609 Other forms of dyspnea: Secondary | ICD-10-CM

## 2023-05-25 NOTE — Addendum Note (Signed)
Addended by: Baldo Ash D on: 05/25/2023 01:35 PM   Modules accepted: Orders

## 2023-05-25 NOTE — Patient Instructions (Signed)

## 2023-05-25 NOTE — Progress Notes (Signed)
Cardiology Office Note:    Date:  05/25/2023   ID:  Erika Hamilton, DOB [birth year], MRN [id number]  PCP:  Philemon Kingdom, MD  Cardiologist:  Gypsy Balsam, MD    Referring MD: Philemon Kingdom, MD   Chief Complaint  Patient presents with   Follow-up  Doing fine  History of Present Illness:    Erika Hamilton is a 89 y.o. female past medical history significant for coronary artery disease.  In 2015 he should get cardiac catheterization done which she will completely occluded distal portion of the left anterior descending artery, no intervention has been performed at that time.  Additional problem include essential hypertension, dyslipidemia, diabetes. She comes today to months for follow-up.  Overall she seems to be doing fine.  She walks around with a cane.  Complain of being weak tired exhausted but no other specific complaint.  Past Medical History:  Diagnosis Date   Bereavement 05/04/2018   one of her sons was killed by a drunk driver in May 2019   Cancer [facility])    breast, tongue, skin cancer   Coronary artery disease involving native coronary artery of native heart with angina pectoris (HCC) 12/25/2014   Overview:  Severe stenosis of distal LAD & s/p NSTEMI in Sep 2015   Diabetes mellitus without complication (HCC)    Dyslipidemia 12/25/2014   Overview:  11/06/2014 - TC 165/Trig 117/HDL 53/LDL 89   Essential hypertension 12/25/2014   Gall bladder disease 05/31/2014   Gastroesophageal reflux disease without esophagitis 12/25/2014   Generalized osteoarthritis 12/25/2014   GERD (gastroesophageal reflux disease)    Headache(784.0)    Heart murmur    Hepatic steatosis 12/25/2014   Hx of repair of rotator cuff    Hyperlipidemia associated with type 2 diabetes mellitus (HCC) 12/25/2014   Formatting of this note might be different from the original. Overview:  11/06/2014 - TC 165/Trig 117/HDL 53/LDL 89  Overview:  40/98/1191 - TC 165/Trig 117/HDL 53/LDL 89 Formatting  of this note might be different from the original. 11/06/2014 - TC 165/Trig 117/HDL 53/LDL 89   Hypertension    Insomnia due to medical condition 01/13/2017   Malignant neoplasm of left female breast (HCC) 12/25/2014   MI (myocardial infarction) (HCC)    Pancreatitis    Severe obesity (BMI 35.0-39.9) with comorbidity (HCC) 12/25/2014   Type 2 diabetes mellitus with hyperglycemia (HCC) 12/25/2014   Overview:  11/06/2014 HgbA1C was 8.0% (Micral/Creat ratio same date was normal at 7.7 - < 30)   Type 2 diabetes mellitus with hyperglycemia, without long-term current use of insulin (HCC) 12/25/2014   11/06/2014 HgbA1C was 8.0% (Micral/Creat ratio same date was normal at 7.7 - < 30)    Past Surgical History:  Procedure Laterality Date   BREAST SURGERY Left    lumpectomy   CHOLECYSTECTOMY N/A 07/09/2014   Procedure: LAPAROSCOPIC CHOLECYSTECTOMY WITH INTRAOPERATIVE CHOLANGIOGRAM;  Surgeon: Kandis Cocking, MD;  Location: [facility] OR;  Service: General;  Laterality: N/A;   CORONARY ANGIOPLASTY     HERNIA REPAIR     umbilical   SHOULDER SURGERY     TONGUE SURGERY     removal of cancerous mass   TONSILLECTOMY      Current Medications: Current Meds  Medication Sig   aspirin EC 81 MG tablet Take 81 mg by mouth daily.   atorvastatin (LIPITOR) 40 MG tablet Take 40 mg by mouth daily. 1 tab every other day alternating with 1/2 tab every other day.   calcium-vitamin  D (OSCAL WITH D) 500-200 MG-UNIT tablet Take 2 tablets by mouth daily with breakfast.   carvedilol (COREG) 12.5 MG tablet Take 25 mg by mouth 2 (two) times daily with a meal.    ferrous sulfate 325 (65 FE) MG EC tablet Take 325 mg by mouth daily with breakfast.   ketoconazole (NIZORAL) 2 % shampoo Apply 1 application topically 2 (two) times a week.   nitroGLYCERIN (NITROSTAT) 0.4 MG SL tablet Place 1 tablet (0.4 mg total) under the tongue every 5 (five) minutes as needed for chest pain. Patient needs appointment for further refills. 1 st attempt    nystatin cream (MYCOSTATIN) Apply 1 application topically 2 (two) times daily. Unknown strength   Omega-3 Fatty Acids (FISH OIL) 1200 MG CAPS Take 1,200 mg by mouth in the morning and at bedtime.   pantoprazole (PROTONIX) 20 MG tablet Take 20 mg by mouth daily.    Probiotic Product (PROBIOTIC DAILY PO) Take 1 capsule by mouth daily after breakfast.   Probiotic Product (PROBIOTIC DAILY PO) Take 1 tablet by mouth daily after breakfast.   quinapril (ACCUPRIL) 40 MG tablet Take 40 mg by mouth daily.    TRESIBA FLEXTOUCH 200 UNIT/ML FlexTouch Pen Inject 40 Units into the skin daily.     Allergies:   Atorvastatin and Metformin hcl   Social History   Socioeconomic History   Marital status: Married    Spouse name: Not on file   Number of children: Not on file   Years of education: Not on file   Highest education level: Not on file  Occupational History   Not on file  Tobacco Use   Smoking status: Never   Smokeless tobacco: Never  Vaping Use   Vaping Use: Never used  Substance and Sexual Activity   Alcohol use: No   Drug use: No   Sexual activity: Not on file  Other Topics Concern   Not on file  Social History Narrative   Not on file   Social Determinants of Health   Financial Resource Strain: Not on file  Food Insecurity: Not on file  Transportation Needs: Not on file  Physical Activity: Not on file  Stress: Not on file  Social Connections: Not on file     Family History: The patient's family history includes Coronary artery disease in her father; Diabetes in her maternal aunt, maternal uncle, and mother. ROS:   Please see the history of present illness.    All 14 point review of systems negative except as described per history of present illness  EKGs/Labs/Other Studies Reviewed:         Recent Labs: No results found for requested labs within last 365 days.  Recent Lipid Panel No results found for: "CHOL", "TRIG", "HDL", "CHOLHDL", "VLDL", "LDLCALC",  "LDLDIRECT"  Physical Exam:    VS:  BP 122/74 (BP Location: Left Arm, Patient Position: Sitting)   Pulse 67   Ht 5' (1.524 m)   Wt 174 lb 3.2 oz (79 kg)   SpO2 97%   BMI 34.02 kg/m     Wt Readings from Last 3 Encounters:  05/25/23 174 lb 3.2 oz (79 kg)  02/19/22 163 lb 9.6 oz (74.2 kg)  08/18/21 165 lb 9.6 oz (75.1 kg)     GEN:  Well nourished, well developed in no acute distress HEENT: Normal NECK: No JVD; No carotid bruits LYMPHATICS: No lymphadenopathy CARDIAC: RRR, holosystolic murmur rate 2/6 Left border sternum, no rubs, no gallops RESPIRATORY:  Clear to auscultation without  rales, wheezing or rhonchi  ABDOMEN: Soft, non-tender, non-distended MUSCULOSKELETAL:  No edema; No deformity  SKIN: Warm and dry LOWER EXTREMITIES: no swelling NEUROLOGIC:  Alert and oriented x 3 PSYCHIATRIC:  Normal affect   ASSESSMENT:    1. Coronary artery disease involving native coronary artery of native heart with angina pectoris (HCC)   2. Essential hypertension   3. Hyperlipidemia associated with type 2 diabetes mellitus (HCC)   4. Dyslipidemia    PLAN:    In order of problems listed above:  Coronary disease: Stable from that point review denies have any symptoms that would suggest activation of the problem. Essential hypertension blood pressure well-controlled continue present management. Dyslipidemia I did review K PN which show me her LDL 72 HDL 48 this is from last year. Mitral regurgitation.  Will schedule her to have echocardiogram to reassess degree of mitral regurgitation.   Medication Adjustments/Labs and Tests Ordered: Current medicines are reviewed at length with the patient today.  Concerns regarding medicines are outlined above.  No orders of the defined types were placed in this encounter.  Medication changes: No orders of the defined types were placed in this encounter.   Signed, Georgeanna Lea, MD, [facility] 05/25/2023 1:26 PM    [facility]

## 2023-06-07 DIAGNOSIS — E782 Mixed hyperlipidemia: Secondary | ICD-10-CM | POA: Diagnosis not present

## 2023-06-07 DIAGNOSIS — E669 Obesity, unspecified: Secondary | ICD-10-CM | POA: Diagnosis not present

## 2023-06-07 DIAGNOSIS — E1140 Type 2 diabetes mellitus with diabetic neuropathy, unspecified: Secondary | ICD-10-CM | POA: Diagnosis not present

## 2023-06-07 DIAGNOSIS — K760 Fatty (change of) liver, not elsewhere classified: Secondary | ICD-10-CM | POA: Diagnosis not present

## 2023-06-07 DIAGNOSIS — E1165 Type 2 diabetes mellitus with hyperglycemia: Secondary | ICD-10-CM | POA: Diagnosis not present

## 2023-06-14 DIAGNOSIS — Z7189 Other specified counseling: Secondary | ICD-10-CM | POA: Diagnosis not present

## 2023-06-14 DIAGNOSIS — E1159 Type 2 diabetes mellitus with other circulatory complications: Secondary | ICD-10-CM | POA: Diagnosis not present

## 2023-06-14 DIAGNOSIS — Z6833 Body mass index (BMI) 33.0-33.9, adult: Secondary | ICD-10-CM | POA: Diagnosis not present

## 2023-06-14 DIAGNOSIS — Z0000 Encounter for general adult medical examination without abnormal findings: Secondary | ICD-10-CM | POA: Diagnosis not present

## 2023-06-14 DIAGNOSIS — I700 Atherosclerosis of aorta: Secondary | ICD-10-CM | POA: Diagnosis not present

## 2023-06-14 DIAGNOSIS — Z1331 Encounter for screening for depression: Secondary | ICD-10-CM | POA: Diagnosis not present

## 2023-06-14 DIAGNOSIS — E785 Hyperlipidemia, unspecified: Secondary | ICD-10-CM | POA: Diagnosis not present

## 2023-06-14 DIAGNOSIS — K219 Gastro-esophageal reflux disease without esophagitis: Secondary | ICD-10-CM | POA: Diagnosis not present

## 2023-06-14 DIAGNOSIS — I10 Essential (primary) hypertension: Secondary | ICD-10-CM | POA: Diagnosis not present

## 2023-06-14 DIAGNOSIS — I25119 Atherosclerotic heart disease of native coronary artery with unspecified angina pectoris: Secondary | ICD-10-CM | POA: Diagnosis not present

## 2023-06-25 ENCOUNTER — Ambulatory Visit: Payer: PPO

## 2023-07-20 ENCOUNTER — Ambulatory Visit: Payer: PPO | Attending: Cardiology

## 2023-07-20 DIAGNOSIS — R0609 Other forms of dyspnea: Secondary | ICD-10-CM | POA: Diagnosis not present

## 2023-07-20 LAB — ECHOCARDIOGRAM COMPLETE
AR max vel: 1.18 cm2
AV Area VTI: 1.23 cm2
AV Area mean vel: 1.15 cm2
AV Mean grad: 9.5 mmHg
AV Peak grad: 16.6 mmHg
Ao pk vel: 2.04 m/s
Area-P 1/2: 3.47 cm2
MV M vel: 5.04 m/s
MV Peak grad: 101.6 mmHg
MV VTI: 1.25 cm2
S' Lateral: 2.4 cm

## 2023-08-03 ENCOUNTER — Telehealth: Payer: Self-pay

## 2023-08-03 NOTE — Telephone Encounter (Signed)
Patient notified of results.

## 2023-08-03 NOTE — Telephone Encounter (Signed)
-----   Message from Gypsy Balsam sent at 07/22/2023 10:11 AM EDT ----- Echocardiogram showed preserved left ventricle ejection fraction, mild mitral stenosis, mild aortic valve stenosis all of this chest for medical therapy

## 2023-08-04 DIAGNOSIS — M25571 Pain in right ankle and joints of right foot: Secondary | ICD-10-CM | POA: Diagnosis not present

## 2023-08-04 DIAGNOSIS — E119 Type 2 diabetes mellitus without complications: Secondary | ICD-10-CM | POA: Diagnosis not present

## 2023-08-04 DIAGNOSIS — M2011 Hallux valgus (acquired), right foot: Secondary | ICD-10-CM | POA: Diagnosis not present

## 2023-08-04 DIAGNOSIS — L84 Corns and callosities: Secondary | ICD-10-CM | POA: Diagnosis not present

## 2023-08-04 DIAGNOSIS — M25471 Effusion, right ankle: Secondary | ICD-10-CM | POA: Diagnosis not present

## 2023-10-12 DIAGNOSIS — I10 Essential (primary) hypertension: Secondary | ICD-10-CM | POA: Diagnosis not present

## 2023-10-12 DIAGNOSIS — E782 Mixed hyperlipidemia: Secondary | ICD-10-CM | POA: Diagnosis not present

## 2023-10-12 DIAGNOSIS — E1165 Type 2 diabetes mellitus with hyperglycemia: Secondary | ICD-10-CM | POA: Diagnosis not present

## 2023-10-12 DIAGNOSIS — I2510 Atherosclerotic heart disease of native coronary artery without angina pectoris: Secondary | ICD-10-CM | POA: Diagnosis not present

## 2023-10-12 DIAGNOSIS — K760 Fatty (change of) liver, not elsewhere classified: Secondary | ICD-10-CM | POA: Diagnosis not present

## 2023-10-12 DIAGNOSIS — E559 Vitamin D deficiency, unspecified: Secondary | ICD-10-CM | POA: Diagnosis not present

## 2023-10-12 DIAGNOSIS — E1140 Type 2 diabetes mellitus with diabetic neuropathy, unspecified: Secondary | ICD-10-CM | POA: Diagnosis not present

## 2023-10-18 DIAGNOSIS — R609 Edema, unspecified: Secondary | ICD-10-CM | POA: Diagnosis not present

## 2023-10-18 DIAGNOSIS — S8701XA Crushing injury of right knee, initial encounter: Secondary | ICD-10-CM | POA: Diagnosis not present

## 2023-10-18 DIAGNOSIS — M25561 Pain in right knee: Secondary | ICD-10-CM | POA: Diagnosis not present

## 2023-10-18 DIAGNOSIS — I10 Essential (primary) hypertension: Secondary | ICD-10-CM | POA: Diagnosis not present

## 2023-10-19 ENCOUNTER — Observation Stay (HOSPITAL_COMMUNITY): Payer: PPO

## 2023-10-19 ENCOUNTER — Emergency Department (HOSPITAL_COMMUNITY): Payer: PPO

## 2023-10-19 ENCOUNTER — Observation Stay (HOSPITAL_COMMUNITY)
Admission: EM | Admit: 2023-10-19 | Discharge: 2023-10-22 | Disposition: A | Discharge status: Skilled Nursing Facility | Payer: PPO | Attending: Internal Medicine | Admitting: Internal Medicine

## 2023-10-19 ENCOUNTER — Other Ambulatory Visit: Payer: Self-pay

## 2023-10-19 ENCOUNTER — Encounter (HOSPITAL_COMMUNITY): Payer: Self-pay | Admitting: Internal Medicine

## 2023-10-19 DIAGNOSIS — S8991XA Unspecified injury of right lower leg, initial encounter: Secondary | ICD-10-CM | POA: Diagnosis not present

## 2023-10-19 DIAGNOSIS — R2689 Other abnormalities of gait and mobility: Secondary | ICD-10-CM | POA: Insufficient documentation

## 2023-10-19 DIAGNOSIS — N390 Urinary tract infection, site not specified: Secondary | ICD-10-CM | POA: Diagnosis not present

## 2023-10-19 DIAGNOSIS — I10 Essential (primary) hypertension: Secondary | ICD-10-CM | POA: Diagnosis not present

## 2023-10-19 DIAGNOSIS — D72829 Elevated white blood cell count, unspecified: Secondary | ICD-10-CM | POA: Diagnosis not present

## 2023-10-19 DIAGNOSIS — Z85828 Personal history of other malignant neoplasm of skin: Secondary | ICD-10-CM | POA: Insufficient documentation

## 2023-10-19 DIAGNOSIS — M47816 Spondylosis without myelopathy or radiculopathy, lumbar region: Secondary | ICD-10-CM | POA: Diagnosis not present

## 2023-10-19 DIAGNOSIS — Z79899 Other long term (current) drug therapy: Secondary | ICD-10-CM | POA: Insufficient documentation

## 2023-10-19 DIAGNOSIS — S8001XA Contusion of right knee, initial encounter: Secondary | ICD-10-CM | POA: Diagnosis not present

## 2023-10-19 DIAGNOSIS — W1811XA Fall from or off toilet without subsequent striking against object, initial encounter: Secondary | ICD-10-CM | POA: Insufficient documentation

## 2023-10-19 DIAGNOSIS — M7041 Prepatellar bursitis, right knee: Secondary | ICD-10-CM

## 2023-10-19 DIAGNOSIS — Z7982 Long term (current) use of aspirin: Secondary | ICD-10-CM | POA: Insufficient documentation

## 2023-10-19 DIAGNOSIS — I6529 Occlusion and stenosis of unspecified carotid artery: Secondary | ICD-10-CM | POA: Diagnosis not present

## 2023-10-19 DIAGNOSIS — W1830XA Fall on same level, unspecified, initial encounter: Secondary | ICD-10-CM | POA: Diagnosis present

## 2023-10-19 DIAGNOSIS — R2681 Unsteadiness on feet: Secondary | ICD-10-CM | POA: Insufficient documentation

## 2023-10-19 DIAGNOSIS — M25461 Effusion, right knee: Secondary | ICD-10-CM | POA: Diagnosis not present

## 2023-10-19 DIAGNOSIS — M25561 Pain in right knee: Secondary | ICD-10-CM | POA: Diagnosis not present

## 2023-10-19 DIAGNOSIS — S199XXA Unspecified injury of neck, initial encounter: Secondary | ICD-10-CM | POA: Diagnosis not present

## 2023-10-19 DIAGNOSIS — Y92002 Bathroom of unspecified non-institutional (private) residence single-family (private) house as the place of occurrence of the external cause: Secondary | ICD-10-CM | POA: Diagnosis not present

## 2023-10-19 DIAGNOSIS — I2510 Atherosclerotic heart disease of native coronary artery without angina pectoris: Secondary | ICD-10-CM | POA: Insufficient documentation

## 2023-10-19 DIAGNOSIS — E119 Type 2 diabetes mellitus without complications: Secondary | ICD-10-CM | POA: Diagnosis not present

## 2023-10-19 DIAGNOSIS — Z853 Personal history of malignant neoplasm of breast: Secondary | ICD-10-CM | POA: Insufficient documentation

## 2023-10-19 DIAGNOSIS — S0990XA Unspecified injury of head, initial encounter: Secondary | ICD-10-CM | POA: Diagnosis not present

## 2023-10-19 DIAGNOSIS — W19XXXA Unspecified fall, initial encounter: Principal | ICD-10-CM

## 2023-10-19 DIAGNOSIS — M6281 Muscle weakness (generalized): Secondary | ICD-10-CM | POA: Insufficient documentation

## 2023-10-19 DIAGNOSIS — Z23 Encounter for immunization: Secondary | ICD-10-CM | POA: Diagnosis not present

## 2023-10-19 DIAGNOSIS — S0993XA Unspecified injury of face, initial encounter: Secondary | ICD-10-CM | POA: Diagnosis not present

## 2023-10-19 LAB — CBC WITH DIFFERENTIAL/PLATELET
Abs Immature Granulocytes: 0.09 10*3/uL — ABNORMAL HIGH (ref 0.00–0.07)
Basophils Absolute: 0.1 10*3/uL (ref 0.0–0.1)
Basophils Relative: 1 %
Eosinophils Absolute: 0.3 10*3/uL (ref 0.0–0.5)
Eosinophils Relative: 2 %
HCT: 39.6 % (ref 36.0–46.0)
Hemoglobin: 12.4 g/dL (ref 12.0–15.0)
Immature Granulocytes: 1 %
Lymphocytes Relative: 16 %
Lymphs Abs: 2.8 10*3/uL (ref 0.7–4.0)
MCH: 28.8 pg (ref 26.0–34.0)
MCHC: 31.3 g/dL (ref 30.0–36.0)
MCV: 92.1 fL (ref 80.0–100.0)
Monocytes Absolute: 1.1 10*3/uL — ABNORMAL HIGH (ref 0.1–1.0)
Monocytes Relative: 6 %
Neutro Abs: 13.3 10*3/uL — ABNORMAL HIGH (ref 1.7–7.7)
Neutrophils Relative %: 74 %
Platelets: 285 10*3/uL (ref 150–400)
RBC: 4.3 MIL/uL (ref 3.87–5.11)
RDW: 13.2 % (ref 11.5–15.5)
WBC: 17.6 10*3/uL — ABNORMAL HIGH (ref 4.0–10.5)
nRBC: 0 % (ref 0.0–0.2)

## 2023-10-19 LAB — COMPREHENSIVE METABOLIC PANEL
ALT: 12 U/L (ref 0–44)
AST: 16 U/L (ref 15–41)
Albumin: 3.2 g/dL — ABNORMAL LOW (ref 3.5–5.0)
Alkaline Phosphatase: 61 U/L (ref 38–126)
Anion gap: 7 (ref 5–15)
BUN: 25 mg/dL — ABNORMAL HIGH (ref 8–23)
CO2: 25 mmol/L (ref 22–32)
Calcium: 9.1 mg/dL (ref 8.9–10.3)
Chloride: 106 mmol/L (ref 98–111)
Creatinine, Ser: 1.04 mg/dL — ABNORMAL HIGH (ref 0.44–1.00)
GFR, Estimated: 52 mL/min — ABNORMAL LOW (ref >60)
Glucose, Bld: 195 mg/dL — ABNORMAL HIGH (ref 70–99)
Potassium: 4.5 mmol/L (ref 3.5–5.1)
Sodium: 138 mmol/L (ref 135–145)
Total Bilirubin: 0.6 mg/dL (ref <1.2)
Total Protein: 6.8 g/dL (ref 6.5–8.1)

## 2023-10-19 LAB — GLUCOSE, CAPILLARY
Glucose-Capillary: 150 mg/dL — ABNORMAL HIGH (ref 70–99)
Glucose-Capillary: 231 mg/dL — ABNORMAL HIGH (ref 70–99)
Glucose-Capillary: 160 mg/dL — ABNORMAL HIGH (ref 70–99)
Glucose-Capillary: 187 mg/dL — ABNORMAL HIGH (ref 70–99)

## 2023-10-19 LAB — URINALYSIS, ROUTINE W REFLEX MICROSCOPIC
Bilirubin Urine: NEGATIVE
Glucose, UA: 50 mg/dL — AB
Hgb urine dipstick: NEGATIVE
Ketones, ur: NEGATIVE mg/dL
Nitrite: NEGATIVE
Protein, ur: NEGATIVE mg/dL
Specific Gravity, Urine: 1.023 (ref 1.005–1.030)
pH: 5 (ref 5.0–8.0)

## 2023-10-19 MED ORDER — LACTATED RINGERS IV BOLUS
500.0000 mL | Freq: Once | INTRAVENOUS | Status: AC
  Administered 2023-10-19: 500 mL via INTRAVENOUS

## 2023-10-19 MED ORDER — OXYCODONE HCL 5 MG PO TABS
2.5000 mg | ORAL_TABLET | ORAL | Status: DC | PRN
  Administered 2023-10-19: 2.5 mg via ORAL
  Filled 2023-10-19: qty 1

## 2023-10-19 MED ORDER — POLYETHYLENE GLYCOL 3350 17 G PO PACK
17.0000 g | PACK | Freq: Every day | ORAL | Status: DC | PRN
  Administered 2023-10-20 – 2023-10-22 (×2): 17 g via ORAL
  Filled 2023-10-19 (×2): qty 1

## 2023-10-19 MED ORDER — PANTOPRAZOLE SODIUM 20 MG PO TBEC: 20.0000 mg | DELAYED_RELEASE_TABLET | Freq: Every day | ORAL | Status: DC | PRN

## 2023-10-19 MED ORDER — ATORVASTATIN CALCIUM 40 MG PO TABS
40.0000 mg | ORAL_TABLET | Freq: Every day | ORAL | Status: DC
  Administered 2023-10-19 – 2023-10-22 (×4): 40 mg via ORAL
  Filled 2023-10-19 (×4): qty 1

## 2023-10-19 MED ORDER — HYDROMORPHONE HCL 1 MG/ML IJ SOLN
0.5000 mg | Freq: Once | INTRAMUSCULAR | Status: AC
  Administered 2023-10-19: 0.5 mg via INTRAVENOUS
  Filled 2023-10-19: qty 1

## 2023-10-19 MED ORDER — TETANUS-DIPHTH-ACELL PERTUSSIS 5-2.5-18.5 LF-MCG/0.5 IM SUSY
0.5000 mL | PREFILLED_SYRINGE | Freq: Once | INTRAMUSCULAR | Status: AC
  Administered 2023-10-19: 0.5 mL via INTRAMUSCULAR
  Filled 2023-10-19: qty 0.5

## 2023-10-19 MED ORDER — ACETAMINOPHEN 500 MG PO TABS
1000.0000 mg | ORAL_TABLET | Freq: Four times a day (QID) | ORAL | Status: DC | PRN
  Administered 2023-10-20: 1000 mg via ORAL
  Filled 2023-10-19: qty 2

## 2023-10-19 MED ORDER — INSULIN ASPART 100 UNIT/ML IJ SOLN
0.0000 [IU] | Freq: Three times a day (TID) | INTRAMUSCULAR | Status: DC
  Administered 2023-10-19: 2 [IU] via SUBCUTANEOUS
  Administered 2023-10-19: 1 [IU] via SUBCUTANEOUS
  Administered 2023-10-20 – 2023-10-21 (×4): 2 [IU] via SUBCUTANEOUS
  Administered 2023-10-21 (×2): 1 [IU] via SUBCUTANEOUS
  Administered 2023-10-22: 2 [IU] via SUBCUTANEOUS
  Administered 2023-10-22: 1 [IU] via SUBCUTANEOUS

## 2023-10-19 MED ORDER — OXYCODONE HCL 5 MG PO TABS
5.0000 mg | ORAL_TABLET | ORAL | Status: DC | PRN
  Administered 2023-10-20 – 2023-10-21 (×3): 5 mg via ORAL
  Filled 2023-10-19 (×3): qty 1

## 2023-10-19 MED ORDER — TETANUS-DIPHTH-ACELL PERTUSSIS 5-2.5-18.5 LF-MCG/0.5 IM SUSY
PREFILLED_SYRINGE | INTRAMUSCULAR | Status: AC
  Filled 2023-10-19: qty 0.5

## 2023-10-19 MED ORDER — FERROUS SULFATE 325 (65 FE) MG PO TABS
325.0000 mg | ORAL_TABLET | Freq: Every day | ORAL | Status: DC
  Administered 2023-10-19 – 2023-10-22 (×4): 325 mg via ORAL
  Filled 2023-10-19 (×4): qty 1

## 2023-10-19 MED ORDER — INSULIN GLARGINE-YFGN 100 UNIT/ML ~~LOC~~ SOLN
40.0000 [IU] | Freq: Every day | SUBCUTANEOUS | Status: DC
Start: 2023-10-19 — End: 2023-10-22
  Administered 2023-10-19 – 2023-10-22 (×4): 40 [IU] via SUBCUTANEOUS
  Filled 2023-10-19 (×6): qty 0.4

## 2023-10-19 MED ORDER — ASPIRIN 81 MG PO TBEC
81.0000 mg | DELAYED_RELEASE_TABLET | Freq: Every day | ORAL | Status: DC
  Administered 2023-10-19 – 2023-10-22 (×4): 81 mg via ORAL
  Filled 2023-10-19 (×4): qty 1

## 2023-10-19 MED ORDER — CARVEDILOL 25 MG PO TABS
25.0000 mg | ORAL_TABLET | Freq: Two times a day (BID) | ORAL | Status: DC
  Administered 2023-10-19 – 2023-10-22 (×7): 25 mg via ORAL
  Filled 2023-10-19 (×7): qty 1

## 2023-10-19 MED ORDER — HYDROMORPHONE HCL 1 MG/ML IJ SOLN: 0.5000 mg | INTRAMUSCULAR | Status: DC | PRN

## 2023-10-19 MED ORDER — SODIUM CHLORIDE 0.9 % IV SOLN
1.0000 g | INTRAVENOUS | Status: DC
  Administered 2023-10-19 – 2023-10-22 (×4): 1 g via INTRAVENOUS
  Filled 2023-10-19 (×4): qty 10

## 2023-10-19 NOTE — Progress Notes (Signed)
  Progress Note   Patient: Erika Hamilton ZOX:[id number] DOB: 23-Apr-1933 DOA: 10/19/2023     0 DOS: the patient was seen and examined on 10/19/2023   Brief hospital course: No notes on file  Assessment and Plan:  89 year old female with history of diabetes mellitus, hypertension, hyperlipidemia, CAD, obesity, breast cancer, squamous cell carcinoma of the tongue is brought to the emergency department by EMS after a fall at home while getting out of the bathroom, tripped and fell.  Denied any loss of consciousness.  Patient fell forward landing on her right knee and hit her head, followed by nosebleed.  CT head without contrast did not show any acute intracranial abnormality.  CT C-spine, maxillofacial, x-ray of the hip negative for any acute injuries.  CT of the right knee showed large anterior patellar hematoma related to bursal rupture, bursitis.  Lab workup is unremarkable  Right knee prepatellar hematoma -Continue with pain management as needed -Apply ice, rest -consult orthopedic surgery  Hypertension accelerated -Secondary to pain -Continue with pain management as needed -Continue with amlodipine  Diabetes mellitus -Get hemoglobin A1c -Follow-up on sliding scale insulin  Subjective:  complaining of pain in the right knee  Physical Exam: Vitals:   10/19/23 0412 10/19/23 0615 10/19/23 0748 10/19/23 0816  BP: (!) 133/52 (!) 148/72 (!) 144/56 (!) 166/78  Pulse: 71 72 71 75  Resp: 16 20 18 18   Temp: 98 F (36.7 C) 98 F (36.7 C) 97.7 F (36.5 C) (!) 97.5 F (36.4 C)  TempSrc: Oral Oral Oral Oral  SpO2: 100% 100% 99% 100%   Gen: Awake, alert, NAD CV: Regular, normal S1, S2, no murmurs  Resp: Normal WOB, CTAB  Abd: Flat, normoactive, nontender MSK: Right knee has an Ace wrap in place, large effusion over the right knee, tenderness along the joint line as well, limited active and passive range of motion secondary to pain.  Distally neurovascularly intact. Skin:  Ecchymosis over the left eyebrow, see MSK for additional findings Neuro: Alert and interactive  Psych: euthymic, appropriate   Family Communication:  patient's son, patient  Disposition: Status is: Observation The patient will require care spanning > 2 midnights and should be moved to inpatient because:  severe right  knee pain  Planned Discharge Destination:  based on physical therapy evaluation  Time spent:  40 minutes  Author: Susa Griffins, MD 10/19/2023 9:42 AM  For on call review [email or web address].

## 2023-10-19 NOTE — ED Notes (Signed)
Patient transported to X-ray 

## 2023-10-19 NOTE — ED Triage Notes (Signed)
Patient arrived with EMS from home lost her balance and fell this evening , reports right knee pain with mild swelling . CBG= 206. Alert and oriented . No LOC . No anticoagulant medications / denies head injury .

## 2023-10-19 NOTE — H&P (Signed)
History and Physical    Erika Hamilton ZHY:[id number] DOB: 1933/08/27 DOA: 10/19/2023  PCP: Philemon Kingdom, MD   Patient coming from: Home   Chief Complaint:  Chief Complaint  Patient presents with   Fall / Right Knee Injury    HPI:  Erika Hamilton is a 89 y.o. female with hx of diabetes, hypertension, hyperlipidemia, CAD, obesity, history of breast cancer, squamous cell carcinoma tongue, who was brought in by EMS after fall at home.  Reports that prior to her fall she was in normal state of health denies any recent illness, decreased intake.  She was getting out of the bathroom and thinks that she tripped and fell.  She denies any syncopal episode.  She fell forward landing on her right knee and did hit her head.  Had a nosebleed after the fall.  Currently main complaint is pain in the right knee and limited mobility in this leg.  She is not on anticoagulants but does take a daily aspirin.   Review of Systems:  ROS complete and negative except as marked above   Allergies  Allergen Reactions   Atorvastatin     myalgia   Metformin Hcl     GI upset    Prior to Admission medications   Medication Sig Start Date End Date Taking? Authorizing Provider  aspirin EC 81 MG tablet Take 81 mg by mouth daily.   Yes [provider]  atorvastatin (LIPITOR) 40 MG tablet Take 40 mg by mouth daily.   Yes [provider]  calcium-vitamin D (OSCAL WITH D) 500-200 MG-UNIT tablet Take 2 tablets by mouth daily with breakfast.   Yes [provider]  carvedilol (COREG) 12.5 MG tablet Take 25 mg by mouth 2 (two) times daily with a meal.    Yes [provider]  carvedilol (COREG) 25 MG tablet Take 25 mg by mouth 2 (two) times daily. 08/24/23  Yes [provider]  ferrous sulfate 325 (65 FE) MG EC tablet Take 325 mg by mouth daily with breakfast.   Yes [provider]  nitroGLYCERIN (NITROSTAT) 0.4 MG SL tablet Place 1 tablet  (0.4 mg total) under the tongue every 5 (five) minutes as needed for chest pain. Patient needs appointment for further refills. 1 st attempt 04/29/23  Yes Georgeanna Lea, MD  pantoprazole (PROTONIX) 20 MG tablet Take 20 mg by mouth daily as needed for heartburn. 05/25/14  Yes [provider]  TRESIBA FLEXTOUCH 200 UNIT/ML FlexTouch Pen Inject 48 Units into the skin daily. 09/14/20  Yes [provider]  benazepril (LOTENSIN) 40 MG tablet Take 40 mg by mouth daily.    [provider]  Probiotic Product (PROBIOTIC DAILY PO) Take 1 capsule by mouth daily after breakfast.    [provider]  Probiotic Product (PROBIOTIC DAILY PO) Take 1 tablet by mouth daily after breakfast.    [provider]  quinapril (ACCUPRIL) 40 MG tablet Take 40 mg by mouth daily.  05/11/14   [provider]    Past Medical History:  Diagnosis Date   Bereavement 05/04/2018   one of her sons was killed by a drunk driver in May 2019   Cancer [facility])    breast, tongue, skin cancer   Coronary artery disease involving native coronary artery of native heart with angina pectoris (HCC) 12/25/2014   Overview:  Severe stenosis of distal LAD & s/p NSTEMI in Sep 2015   Diabetes mellitus without complication (HCC)  Dyslipidemia 12/25/2014   Overview:  11/06/2014 - TC 165/Trig 117/HDL 53/LDL 89   Essential hypertension 12/25/2014   Gall bladder disease 05/31/2014   Gastroesophageal reflux disease without esophagitis 12/25/2014   Generalized osteoarthritis 12/25/2014   GERD (gastroesophageal reflux disease)    Headache(784.0)    Heart murmur    Hepatic steatosis 12/25/2014   Hx of repair of rotator cuff    Hyperlipidemia associated with type 2 diabetes mellitus (HCC) 12/25/2014   Formatting of this note might be different from the original. Overview:  11/06/2014 - TC 165/Trig 117/HDL 53/LDL 89  Overview:  40/98/1191 - TC 165/Trig 117/HDL 53/LDL 89 Formatting of this note might be different from  the original. 11/06/2014 - TC 165/Trig 117/HDL 53/LDL 89   Hypertension    Insomnia due to medical condition 01/13/2017   Malignant neoplasm of left female breast (HCC) 12/25/2014   MI (myocardial infarction) (HCC)    Pancreatitis    Severe obesity (BMI 35.0-39.9) with comorbidity (HCC) 12/25/2014   Type 2 diabetes mellitus with hyperglycemia (HCC) 12/25/2014   Overview:  11/06/2014 HgbA1C was 8.0% (Micral/Creat ratio same date was normal at 7.7 - < 30)   Type 2 diabetes mellitus with hyperglycemia, without long-term current use of insulin (HCC) 12/25/2014   11/06/2014 HgbA1C was 8.0% (Micral/Creat ratio same date was normal at 7.7 - < 30)    Past Surgical History:  Procedure Laterality Date   BREAST SURGERY Left    lumpectomy   CHOLECYSTECTOMY N/A 07/09/2014   Procedure: LAPAROSCOPIC CHOLECYSTECTOMY WITH INTRAOPERATIVE CHOLANGIOGRAM;  Surgeon: Kandis Cocking, MD;  Location: [facility] OR;  Service: General;  Laterality: N/A;   CORONARY ANGIOPLASTY     HERNIA REPAIR     umbilical   SHOULDER SURGERY     TONGUE SURGERY     removal of cancerous mass   TONSILLECTOMY       reports that she has never smoked. She has never used smokeless tobacco. She reports that she does not drink alcohol and does not use drugs.  Family History  Problem Relation Age of Onset   Diabetes Mother    Coronary artery disease Father    Diabetes Maternal Aunt    Diabetes Maternal Uncle      Physical Exam: Vitals:   10/19/23 0015 10/19/23 0217 10/19/23 0412  BP: (!) 177/61 (!) 140/55 (!) 133/52  Pulse: 71 70 71  Resp: 16 16 16   Temp: 97.9 F (36.6 C) 97.9 F (36.6 C) 98 F (36.7 C)  TempSrc:  Oral Oral  SpO2: 100% 100% 100%    Gen: Awake, alert, NAD CV: Regular, normal S1, S2, no murmurs  Resp: Normal WOB, CTAB  Abd: Flat, normoactive, nontender MSK: Right knee has an Ace wrap in place, large effusion over the right knee, tenderness along the joint line as well, limited active and passive range of motion  secondary to pain.  Distally neurovascularly intact. Skin: Ecchymosis over the left eyebrow, see MSK for additional findings Neuro: Alert and interactive  Psych: euthymic, appropriate    Data review:   Labs reviewed, notable for:   Creatinine 1, baseline unknown WBC 17  Micro:  No results found for this or any previous visit.  Imaging reviewed:  DG CHEST PORT 1 VIEW  Result Date: 10/19/2023 CLINICAL DATA:  Leukocytosis EXAM: PORTABLE CHEST 1 VIEW COMPARISON:  07/27/2014 FINDINGS: A small infiltrate is possible in the left suprahilar lung. No edema, effusion, or pneumothorax. Normal heart size and mediastinal contours. No visible effusion or pneumothorax.  Postoperative left axilla. IMPRESSION: Possible small left suprahilar pneumonia. Electronically Signed   By: Tiburcio Pea M.D.   On: 10/19/2023 04:53   CT Knee Right Wo Contrast  Result Date: 10/19/2023 CLINICAL DATA:  Knee trauma, x-ray done, occult fracture suspected EXAM: CT OF THE RIGHT KNEE WITHOUT CONTRAST TECHNIQUE: Multidetector CT imaging of the right knee was performed according to the standard protocol. Multiplanar CT image reconstructions were also generated. RADIATION DOSE REDUCTION: This exam was performed according to the departmental dose-optimization program which includes automated exposure control, adjustment of the mA and/or kV according to patient size and/or use of iterative reconstruction technique. COMPARISON:  Same day knee radiographs FINDINGS: Bones/Joint/Cartilage No acute fracture or dislocation.  Trace knee joint effusion. Ligaments Suboptimally assessed by CT. Muscles and Tendons Intact. Soft tissues Large intermediate to high density fluid collection anterior to the patella measuring 8.9 x 3.5 x 10.9 cm. Adjacent soft tissue edema in the anterior thigh and calf. IMPRESSION: 1. Large intermediate to high density fluid collection anterior to the patella compatible with hematoma. This may be related to  bursitis/bursal rupture. 2. No acute fracture. Electronically Signed   By: Minerva Fester M.D.   On: 10/19/2023 02:33   DG Hip Unilat W or Wo Pelvis 2-3 Views Right  Result Date: 10/19/2023 CLINICAL DATA:  Fall landed on right knee with knee pain. EXAM: DG HIP (WITH OR WITHOUT PELVIS) 2-3V RIGHT COMPARISON:  None Available. FINDINGS: No acute fracture or dislocation. Degenerative changes pubic symphysis, both hips, SI joints and lower lumbar spine. IMPRESSION: No acute fracture or dislocation. Electronically Signed   By: Minerva Fester M.D.   On: 10/19/2023 02:09   DG Knee Complete 4 Views Right  Result Date: 10/19/2023 CLINICAL DATA:  Right knee pain and swelling status post fall landing on right knee EXAM: RIGHT KNEE - COMPLETE 4+ VIEW COMPARISON:  None Available. FINDINGS: No acute fracture or dislocation. Small knee joint effusion. Focal soft tissue swelling anterior to the patella. IMPRESSION: 1. No acute fracture or dislocation. 2. Focal soft tissue swelling anterior to the patella may be due to a hematoma or prepatellar bursitis. Electronically Signed   By: Minerva Fester M.D.   On: 10/19/2023 02:09   CT Head Wo Contrast  Result Date: 10/19/2023 CLINICAL DATA:  89 year old female lost of balance and fell this evening. EXAM: CT HEAD WITHOUT CONTRAST CT MAXILLOFACIAL WITHOUT CONTRAST CT CERVICAL SPINE WITHOUT CONTRAST TECHNIQUE: Multidetector CT imaging of the head, cervical spine, and maxillofacial structures were performed using the standard protocol without intravenous contrast. Multiplanar CT image reconstructions of the cervical spine and maxillofacial structures were also generated. RADIATION DOSE REDUCTION: This exam was performed according to the departmental dose-optimization program which includes automated exposure control, adjustment of the mA and/or kV according to patient size and/or use of iterative reconstruction technique. COMPARISON:  None Available. FINDINGS: CT HEAD  FINDINGS Brain: No intracranial hemorrhage, mass effect, or evidence of acute infarct. No hydrocephalus. No extra-axial fluid collection. Age related cerebral atrophy and chronic small vessel ischemic disease. Vascular: No hyperdense vessel. Intracranial arterial calcification. Skull: No fracture or focal lesion. Other: None. CT MAXILLOFACIAL FINDINGS Osseous: No fracture or mandibular dislocation. No destructive process. Orbits: Negative. No traumatic or inflammatory finding. Sinuses: Clear. Soft tissues: Negative. CT CERVICAL SPINE FINDINGS Alignment: No evidence of traumatic malalignment. Skull base and vertebrae: No acute fracture. Soft tissues and spinal canal: No prevertebral fluid or swelling. No visible canal hematoma. Disc levels: Mild multilevel spondylosis and facet arthropathy. No  severe spinal canal narrowing. Upper chest: No acute abnormality. Other: Carotid calcification. IMPRESSION: 1. No acute intracranial abnormality. 2. No acute facial bone fracture. 3. No acute fracture in the cervical spine. Electronically Signed   By: Minerva Fester M.D.   On: 10/19/2023 01:21   CT Cervical Spine Wo Contrast  Result Date: 10/19/2023 CLINICAL DATA:  89 year old female lost of balance and fell this evening. EXAM: CT HEAD WITHOUT CONTRAST CT MAXILLOFACIAL WITHOUT CONTRAST CT CERVICAL SPINE WITHOUT CONTRAST TECHNIQUE: Multidetector CT imaging of the head, cervical spine, and maxillofacial structures were performed using the standard protocol without intravenous contrast. Multiplanar CT image reconstructions of the cervical spine and maxillofacial structures were also generated. RADIATION DOSE REDUCTION: This exam was performed according to the departmental dose-optimization program which includes automated exposure control, adjustment of the mA and/or kV according to patient size and/or use of iterative reconstruction technique. COMPARISON:  None Available. FINDINGS: CT HEAD FINDINGS Brain: No intracranial  hemorrhage, mass effect, or evidence of acute infarct. No hydrocephalus. No extra-axial fluid collection. Age related cerebral atrophy and chronic small vessel ischemic disease. Vascular: No hyperdense vessel. Intracranial arterial calcification. Skull: No fracture or focal lesion. Other: None. CT MAXILLOFACIAL FINDINGS Osseous: No fracture or mandibular dislocation. No destructive process. Orbits: Negative. No traumatic or inflammatory finding. Sinuses: Clear. Soft tissues: Negative. CT CERVICAL SPINE FINDINGS Alignment: No evidence of traumatic malalignment. Skull base and vertebrae: No acute fracture. Soft tissues and spinal canal: No prevertebral fluid or swelling. No visible canal hematoma. Disc levels: Mild multilevel spondylosis and facet arthropathy. No severe spinal canal narrowing. Upper chest: No acute abnormality. Other: Carotid calcification. IMPRESSION: 1. No acute intracranial abnormality. 2. No acute facial bone fracture. 3. No acute fracture in the cervical spine. Electronically Signed   By: Minerva Fester M.D.   On: 10/19/2023 01:21   CT Maxillofacial Wo Contrast  Result Date: 10/19/2023 CLINICAL DATA:  89 year old female lost of balance and fell this evening. EXAM: CT HEAD WITHOUT CONTRAST CT MAXILLOFACIAL WITHOUT CONTRAST CT CERVICAL SPINE WITHOUT CONTRAST TECHNIQUE: Multidetector CT imaging of the head, cervical spine, and maxillofacial structures were performed using the standard protocol without intravenous contrast. Multiplanar CT image reconstructions of the cervical spine and maxillofacial structures were also generated. RADIATION DOSE REDUCTION: This exam was performed according to the departmental dose-optimization program which includes automated exposure control, adjustment of the mA and/or kV according to patient size and/or use of iterative reconstruction technique. COMPARISON:  None Available. FINDINGS: CT HEAD FINDINGS Brain: No intracranial hemorrhage, mass effect, or  evidence of acute infarct. No hydrocephalus. No extra-axial fluid collection. Age related cerebral atrophy and chronic small vessel ischemic disease. Vascular: No hyperdense vessel. Intracranial arterial calcification. Skull: No fracture or focal lesion. Other: None. CT MAXILLOFACIAL FINDINGS Osseous: No fracture or mandibular dislocation. No destructive process. Orbits: Negative. No traumatic or inflammatory finding. Sinuses: Clear. Soft tissues: Negative. CT CERVICAL SPINE FINDINGS Alignment: No evidence of traumatic malalignment. Skull base and vertebrae: No acute fracture. Soft tissues and spinal canal: No prevertebral fluid or swelling. No visible canal hematoma. Disc levels: Mild multilevel spondylosis and facet arthropathy. No severe spinal canal narrowing. Upper chest: No acute abnormality. Other: Carotid calcification. IMPRESSION: 1. No acute intracranial abnormality. 2. No acute facial bone fracture. 3. No acute fracture in the cervical spine. Electronically Signed   By: Minerva Fester M.D.   On: 10/19/2023 01:21    ED Course:  Imaging obtained per above.  Treated with Dilaudid, her knee was wrapped and iced.  Assessment/Plan:  89 y.o. female with hx diabetes, hypertension, hyperlipidemia, CAD, obesity, history of breast cancer, squamous cell carcinoma tongue, who was brought in by EMS after fall at home c/b R knee injury with prepatellar hematoma, question of bursal rupture.   Ground-level fall, mechanical Right knee prepatellar hematoma, question bursal rupture Reports mechanical fall stepping out of the bathroom.  Imaging including CT head, C-spine, maxillofacial, hip x-ray negative for acute injuries.  Sustained injury to her right knee, with CT of the right knee demonstrating a large anterior patellar hematoma question related to bursal rupture, bursitis.  She denies any issue with the knee prior to fall making bursitis less likely. -Pain control: Tylenol as needed for mild, oxycodone  2.5/5 mg for moderate/severe, Dilaudid 0.5 mg IV every 4 hours as needed for breakthrough pain -Physical therapy evaluation  Question AKI stage I versus CKD 3 Remote labs with creatinine 0.7 in '17 -> elevated to 1 on admission.  UA does have hyaline cast suggestive of dehydration. - Give LR 500 cc then continue oral hydration.  Follow-up labs including BMP outpatient  Leukocytosis Possible UTI v CAP WBC 17.  History without localizing infectious symptoms.  Chest x-ray question small infiltrate in the left suprahilar lung.  UA with rare bacteria, pyuria, moderate leukocytes, negative nitrates.  While she denies any respiratory symptoms, urinary symptoms considering her fall could indicate general weakness and high leukocytes with neutrophil predominant and some immature granulocytes suggesting could have a true infection. Less likely infectious complication from bursal rupture as this was traumatic.  -Add on urine culture -Start ceftriaxone 1 g IV every 24 hours; favor short course of antibiotics for UTI  Chronic medical problems: Diabetes: Home regimen is Tresiba 48 units daily.  Switch to [facility] with slight reduction to 40 units, plus sliding scale for meals. Hypertension: Hold home benazepril with possible AKI.  Continue carvedilol CAD, hyperlipidemia: Continue home aspirin, atorvastatin IDA: continue home iron sulfate  There is no height or weight on file to calculate BMI.    DVT prophylaxis:  SCDs Code Status:  Full Code Diet:  Diet Orders (From admission, onward)     Start     Ordered   10/19/23 0352  Diet Carb Modified Fluid consistency: Thin; Room service appropriate? Yes  Diet effective now       Question Answer Comment  Diet-HS Snack? Nothing   Calorie Level Medium 1600-2000   Fluid consistency: Thin   Room service appropriate? Yes      10/19/23 0352           Family Communication:  Yes discussed with son at bedside   Consults:  None   Admission status:    Observation, Med-Surg  Severity of Illness: The appropriate patient status for this patient is OBSERVATION. Observation status is judged to be reasonable and necessary in order to provide the required intensity of service to ensure the patient's safety. The patient's presenting symptoms, physical exam findings, and initial radiographic and laboratory data in the context of their medical condition is felt to place them at decreased risk for further clinical deterioration. Furthermore, it is anticipated that the patient will be medically stable for discharge from the hospital within 2 midnights of admission.    Dolly Rias, MD [facility]  How to contact the [facility] Attending or Consulting provider 7A - 7P or covering provider during after hours 7P -7A, for this patient.  Check the care team in [facility] and look for a) attending/consulting [facility] provider listed and  b) the [facility] team listed Log into [email or web address] and use [facility] universal password to access. If you do not have the password, please contact the hospital operator. Locate the [facility] provider you are looking for under [facility] and page to a number that you can be directly reached. If you still have difficulty reaching the provider, please page the [facility] (Director on Call) for the Hospitalists listed on amion for assistance.  10/19/2023, 6:11 AM

## 2023-10-19 NOTE — Progress Notes (Signed)
Pt has not voided. Bladder scan . Per Susa Griffins, MD - no I&O cath needed at this time since volume is <630mL. RN encouraged pt to increase her water intake per Susa Griffins, MD. No new orders at this time.

## 2023-10-19 NOTE — Evaluation (Signed)
Occupational Therapy Evaluation Patient Details Name: Erika Hamilton MRN: [id number] DOB: 1933/10/17 Today's Date: 10/19/2023   History of Present Illness Pt is a 89 y.o. F who presents 10/19/2023 after a fall at home. Found to have right knee prepatellar hematoma, question bursal rupture. Significant PMH: diabetes, HTN, CAD, obesity, breast CA, squamous cell carcinoma tongue.   Clinical Impression   Pt c/o minimal R knee pain at rest, 8/10 pain with movement/weight bearing. Pt has constant pain in neck/upper back at baseline. Pt daughter present during session. Pt lives alone, daughter lives next door and able to assist after work. Pt PLOF independent. Pt currently requires min/mod A for transfer to bedside commode/recliner, not able to ambulate more than a few steps with increased effort. Pt close to baseline with ADLs, uses adaptive equipment for LB ADLs at baseline. Pt would benefit from postacute therapy <3hrs/day to maximize functional independence prior, will continue to follow acutely. DME recs TBD if progress with mobility allows DC home.       If plan is discharge home, recommend the following:      Functional Status Assessment  Patient has had a recent decline in their functional status and demonstrates the ability to make significant improvements in function in a reasonable and predictable amount of time.  Equipment Recommendations  Other (comment) (TBD)    Recommendations for Other Services       Precautions / Restrictions Precautions Precautions: Fall;Other (comment) Precaution Comments: watch BP Restrictions Weight Bearing Restrictions: No      Mobility Bed Mobility Overal bed mobility: Needs Assistance Bed Mobility: Supine to Sit     Supine to sit: Mod assist, HOB elevated     General bed mobility comments: mod A to power sitting up at EOB,    Transfers Overall transfer level: Needs assistance Equipment used: Rolling walker (2  wheels) Transfers: Sit to/from Stand, Bed to chair/wheelchair/BSC Sit to Stand: Min assist     Step pivot transfers: Min assist     General transfer comment: min A for STS and step pivot to recliner      Balance Overall balance assessment: Needs assistance Sitting-balance support: No upper extremity supported, Feet supported Sitting balance-Leahy Scale: Fair Sitting balance - Comments: EOB ADLs   Standing balance support: Bilateral upper extremity supported, Reliant on assistive device for balance Standing balance-Leahy Scale: Poor Standing balance comment: reliant on RW                           ADL either performed or assessed with clinical judgement   ADL Overall ADL's : Needs assistance/impaired Eating/Feeding: Independent   Grooming: Set up;Sitting   Upper Body Bathing: Set up;Sitting   Lower Body Bathing: Set up;Sitting/lateral leans;With adaptive equipment   Upper Body Dressing : Set up;Sitting   Lower Body Dressing: Set up;With adaptive equipment;Sitting/lateral leans   Toilet Transfer: Minimal assistance;BSC/3in1;Rolling walker (2 wheels)   Toileting- Clothing Manipulation and Hygiene: Set up;Sitting/lateral lean         General ADL Comments: Pt close to baseline with ADLs, limited with transfers and standing due go R knee pain. Uses adaptive equipement for LB dressing/bathing at baseline     Vision Baseline Vision/History: 1 Wears glasses Ability to See in Adequate Light: 1 Impaired Patient Visual Report: No change from baseline       Perception         Praxis         Pertinent Vitals/Pain Pain  Assessment Pain Assessment: 0-10 Pain Score: 7  Pain Location: back/shoulders consistently, R knee with activities Pain Descriptors / Indicators: Discomfort, Grimacing, Guarding Pain Intervention(s): Monitored during session     Extremity/Trunk Assessment Upper Extremity Assessment Upper Extremity Assessment: Overall WFL for tasks  assessed   Lower Extremity Assessment Lower Extremity Assessment: Defer to PT evaluation RLE Deficits / Details: Limited heel slide, unable to perform SLR, ankle dorsiflexion WFL       Communication Communication Communication: No apparent difficulties   Cognition Arousal: Alert Behavior During Therapy: WFL for tasks assessed/performed Overall Cognitive Status: Within Functional Limits for tasks assessed                                       General Comments       Exercises     Shoulder Instructions      Home Living Family/patient expects to be discharged to:: Private residence Living Arrangements: Alone Available Help at Discharge: Family;Available PRN/intermittently Type of Home: House Home Access: Stairs to enter [employer] of Steps: 3 Entrance Stairs-Rails: Right;Left Home Layout: One level     Bathroom Shower/Tub: Walk-in shower         Home Equipment: Gilmer Mor - single point;Shower seat;Toilet riser   Additional Comments: pt daughter/son available until monday      Prior Functioning/Environment Prior Level of Function : Independent/Modified Independent             Mobility Comments: using cane          OT Problem List: Decreased strength;Decreased range of motion;Decreased activity tolerance;Impaired balance (sitting and/or standing);Pain      OT Treatment/Interventions: Self-care/ADL training;Therapeutic exercise;Energy conservation;DME and/or AE instruction;Therapeutic activities;Patient/family education    OT Goals(Current goals can be found in the care plan section) Acute Rehab OT Goals Patient Stated Goal: to manage pain OT Goal Formulation: With patient/family Time For Goal Achievement: 11/02/23 Potential to Achieve Goals: Good  OT Frequency: Min 1X/week    Co-evaluation              AM-PAC OT "6 Clicks" Daily Activity     Outcome Measure Help from another person eating meals?: None Help from another  person taking care of personal grooming?: A Little Help from another person toileting, which includes using toliet, bedpan, or urinal?: A Little Help from another person bathing (including washing, rinsing, drying)?: A Little Help from another person to put on and taking off regular upper body clothing?: A Little Help from another person to put on and taking off regular lower body clothing?: A Little 6 Click Score: 19   End of Session Equipment Utilized During Treatment: Gait belt;Rolling walker (2 wheels) Nurse Communication: Mobility status  Activity Tolerance: Patient tolerated treatment well Patient left: in chair;with call bell/phone within reach;with family/visitor present  OT Visit Diagnosis: Unsteadiness on feet (R26.81);Other abnormalities of gait and mobility (R26.89);Muscle weakness (generalized) (M62.81);Pain Pain - Right/Left: Right Pain - part of body: Knee                Time: 1610-9604 OT Time Calculation (min): 26 min Charges:  OT General Charges $OT Visit: 1 Visit OT Evaluation $OT Eval Moderate Complexity: 1 Mod OT Treatments $Self Care/Home Management : 8-22 mins  [address] OTR/L   Alexis Goodell 10/19/2023, 2:03 PM

## 2023-10-19 NOTE — Evaluation (Signed)
Physical Therapy Evaluation Patient Details Name: Erika Hamilton MRN: [id number] DOB: 1932/11/27 Today's Date: 10/19/2023  History of Present Illness  Pt is a 89 y.o. F who presents 10/19/2023 after a fall at home. Found to have right knee prepatellar hematoma, question bursal rupture. Significant PMH: diabetes, HTN, CAD, obesity, breast CA, squamous cell carcinoma tongue.  Clinical Impression  PTA, pt lives alone and uses a cane for mobility and is independent for ADL's. Pt presents with RLE weakness, pain, decreased ROM. Pt requiring maximal assist for bed mobility and two person minimal assist for transfers. Able to take several small side steps edge of bed, but unable to ambulate. Positive for orthostatic hypotension (see vitals flowsheet). Pt daughter reports she will only be available to assist until Monday. Patient will benefit from continued inpatient follow up therapy, <3 hours/day in order to address deficits and maximize functional mobility. Potential to progress home if pain and strength improve prior to d/c.         If plan is discharge home, recommend the following: A lot of help with walking and/or transfers;A lot of help with bathing/dressing/bathroom   Can travel by private vehicle   No    Equipment Recommendations Rolling walker (2 wheels);BSC/3in1;Wheelchair cushion (measurements PT);Wheelchair (measurements PT)  Recommendations for Other Services       Functional Status Assessment Patient has had a recent decline in their functional status and demonstrates the ability to make significant improvements in function in a reasonable and predictable amount of time.     Precautions / Restrictions Precautions Precautions: Fall;Other (comment) Precaution Comments: watch BP Restrictions Weight Bearing Restrictions: No      Mobility  Bed Mobility Overal bed mobility: Needs Assistance Bed Mobility: Supine to Sit, Sit to Supine     Supine to sit: Max  assist Sit to supine: Mod assist, +2 for physical assistance   General bed mobility comments: Significant assist to progress into and out of bed, cues for use of bed rail, progression of RLE off edge of bed, use of bed pad to manuever and scoot hips    Transfers Overall transfer level: Needs assistance Equipment used: Rolling walker (2 wheels) Transfers: Sit to/from Stand Sit to Stand: Min assist, Mod assist, +2 physical assistance           General transfer comment: Min-modA + 2 to rise and initially steady, limited weightbearing through RLE. Able to take several small side steps edge of bed with significant time/effort    Ambulation/Gait                  Stairs            Wheelchair Mobility     Tilt Bed    Modified Rankin (Stroke Patients Only)       Balance Overall balance assessment: Needs assistance Sitting-balance support: Feet supported Sitting balance-Leahy Scale: Fair     Standing balance support: Bilateral upper extremity supported, Reliant on assistive device for balance Standing balance-Leahy Scale: Poor                               Pertinent Vitals/Pain Pain Assessment Pain Assessment: Faces Faces Pain Scale: Hurts whole lot Pain Location: R knee Pain Descriptors / Indicators: Discomfort, Grimacing, Guarding Pain Intervention(s): Limited activity within patient's tolerance, Monitored during session, Patient requesting pain meds-RN notified    Home Living Family/patient expects to be discharged to:: Private residence Living Arrangements: Alone  Available Help at Discharge: Family;Available PRN/intermittently Type of Home: House Home Access: Stairs to enter Entrance Stairs-Rails: Doctor, general practice of Steps: 3   Home Layout: One level Home Equipment: Cane - single point;Shower seat Additional Comments: pt daughter/son available until monday    Prior Function Prior Level of Function :  Independent/Modified Independent             Mobility Comments: using cane       Extremity/Trunk Assessment   Upper Extremity Assessment Upper Extremity Assessment: Defer to OT evaluation    Lower Extremity Assessment Lower Extremity Assessment: RLE deficits/detail RLE Deficits / Details: Limited heel slide, unable to perform SLR, ankle dorsiflexion WFL       Communication   Communication Communication: No apparent difficulties  Cognition Arousal: Alert Behavior During Therapy: WFL for tasks assessed/performed Overall Cognitive Status: Within Functional Limits for tasks assessed                                          General Comments      Exercises     Assessment/Plan    PT Assessment Patient needs continued PT services  PT Problem List Decreased strength;Decreased activity tolerance;Decreased balance;Decreased mobility;Pain       PT Treatment Interventions Gait training;DME instruction;Stair training;Functional mobility training;Therapeutic exercise;Therapeutic activities;Balance training;Patient/family education    PT Goals (Current goals can be found in the Care Plan section)  Acute Rehab PT Goals Patient Stated Goal: return to baseline PT Goal Formulation: With patient Time For Goal Achievement: 11/02/23 Potential to Achieve Goals: Good    Frequency Min 1X/week     Co-evaluation               AM-PAC PT "6 Clicks" Mobility  Outcome Measure Help needed turning from your back to your side while in a flat bed without using bedrails?: A Lot Help needed moving from lying on your back to sitting on the side of a flat bed without using bedrails?: A Lot Help needed moving to and from a bed to a chair (including a wheelchair)?: A Little Help needed standing up from a chair using your arms (e.g., wheelchair or bedside chair)?: A Little Help needed to walk in hospital room?: Total Help needed climbing 3-5 steps with a railing? :  Total 6 Click Score: 12    End of Session Equipment Utilized During Treatment: Gait belt Activity Tolerance: Patient limited by pain Patient left: in bed;with call bell/phone within reach;with bed alarm set Nurse Communication: Mobility status PT Visit Diagnosis: Other abnormalities of gait and mobility (R26.89);History of falling (Z91.81);Difficulty in walking, not elsewhere classified (R26.2);Pain Pain - Right/Left: Right Pain - part of body: Knee    Time: 2440-1027 PT Time Calculation (min) (ACUTE ONLY): 32 min   Charges:   PT Evaluation $PT Eval Low Complexity: 1 Low PT Treatments $Therapeutic Activity: 8-22 mins PT General Charges $$ ACUTE PT VISIT: 1 Visit         Erika Hamilton, PT, DPT Acute Rehabilitation Services Office [phone]   Erika Hamilton 10/19/2023, 1:16 PM

## 2023-10-19 NOTE — TOC Initial Note (Signed)
Transition of Care (TOC) - Initial/Assessment Note   Spoke to patient and daughter Erika Hamilton at bedside. Patient from home alone. PT recommendation is SNF for short term rehab. Patient and daughter in agreement.   TOC Team will fax out to surrounding area and provide bed offers with medicare ratings once received.   Patient's preference is [facility]  Patient Details  Name: Erika Hamilton MRN: [id number] Date of Birth: [birth year]  Transition of Care [facility]) CM/SW Contact:    Kingsley Plan, RN Phone Number: 10/19/2023, 2:31 PM  Clinical Narrative:                   Expected Discharge Plan: Skilled Nursing Facility Barriers to Discharge: Continued Medical Work up   Patient Goals and CMS Choice Patient states their goals for this hospitalization and ongoing recovery are:: to get stronger          Expected Discharge Plan and Services   Discharge Planning Services: CM Consult Post Acute Care Choice: Skilled Nursing Facility Living arrangements for the past 2 months: Single Family Home                 DME Arranged: N/A DME Agency: NA       HH Arranged: NA          Prior Living Arrangements/Services Living arrangements for the past 2 months: Single Family Home Lives with:: Self Patient language and need for interpreter reviewed:: Yes Do you feel safe going back to the place where you live?:  (lives alone , no assistanc e)      Need for Family Participation in Patient Care: Yes (Comment) Care giver support system in place?: No (comment) Current home services: DME Criminal Activity/Legal Involvement Pertinent to Current Situation/Hospitalization: No - Comment as needed  Activities of Daily Living   ADL Screening (condition at time of admission) Independently performs ADLs?: No Does the patient have a NEW difficulty with bathing/dressing/toileting/self-feeding that is expected to last >3 days?: Yes (Initiates electronic notice to provider for  possible OT consult) Does the patient have a NEW difficulty with getting in/out of bed, walking, or climbing stairs that is expected to last >3 days?: Yes (Initiates electronic notice to provider for possible PT consult) Does the patient have a NEW difficulty with communication that is expected to last >3 days?: No Is the patient deaf or have difficulty hearing?: Yes Does the patient have difficulty seeing, even when wearing glasses/contacts?: No Does the patient have difficulty concentrating, remembering, or making decisions?: Yes  Permission Sought/Granted   Permission granted to share information with : Yes, Verbal Permission Granted  Share Information with NAME: daughter Erika Hamilton           Emotional Assessment Appearance:: Appears stated age Attitude/Demeanor/Rapport: Engaged Affect (typically observed): Accepting Orientation: : Oriented to Situation, Oriented to  Time, Oriented to Place, Oriented to Self Alcohol / Substance Use: Not Applicable Psych Involvement: No (comment)  Admission diagnosis:  Fall, initial encounter [W19.XXXA] Traumatic hematoma of right knee, initial encounter [S80.01XA] Ground-level fall [W18.30XA] Patient Active Problem List   Diagnosis Date Noted   Ground-level fall 10/19/2023   Pancreatitis    MI (myocardial infarction) (HCC)    Hypertension    Hx of repair of rotator cuff    Heart murmur    GERD (gastroesophageal reflux disease)    Diabetes mellitus without complication (HCC)    Cancer (HCC)    Bereavement 05/04/2018   Insomnia due to medical condition 01/13/2017  Coronary artery disease involving native coronary artery of native heart with angina pectoris (HCC) 12/25/2014   Essential hypertension 12/25/2014   Gastroesophageal reflux disease without esophagitis 12/25/2014   Hepatic steatosis 12/25/2014   Dyslipidemia 12/25/2014   Type 2 diabetes mellitus with hyperglycemia (HCC) 12/25/2014   Generalized osteoarthritis 12/25/2014   Malignant  neoplasm of left female breast (HCC) 12/25/2014   Severe obesity (BMI 35.0-39.9) with comorbidity (HCC) 12/25/2014   Type 2 diabetes mellitus with hyperglycemia, without long-term current use of insulin (HCC) 12/25/2014   Hyperlipidemia associated with type 2 diabetes mellitus (HCC) 12/25/2014   Gall bladder disease 05/31/2014   PCP:  Philemon Kingdom, MD Pharmacy:   [facility] [address] ([address]), [place] - [address] [place] ([place]) [place] [address] Phone: [phone] Fax: [phone]     Social Determinants of Health (SDOH) Social History: SDOH Screenings   Food Insecurity: No Food Insecurity (10/19/2023)  Housing: Low Risk  (10/19/2023)  Transportation Needs: No Transportation Needs (10/19/2023)  Utilities: Not At Risk (10/19/2023)  Tobacco Use: Low Risk  (10/19/2023)   SDOH Interventions:     Readmission Risk Interventions     No data to display

## 2023-10-19 NOTE — NC FL2 (Signed)
Erika Hamilton MEDICAID [id number] LEVEL OF CARE FORM     IDENTIFICATION  Patient Name: Erika Hamilton Birthdate: 01/10/1933 Sex: female Admission Date (Current Location): 10/19/2023  [facility] and [place] Number:  Producer, television/film/video and Address:  [facility], [address], [place], [place] [address]      Provider Number: [id number]  Attending Physician Name and Address:  Susa Griffins, MD  Relative Name and Phone Number:       Current Level of Care: Hospital Recommended Level of Care: Skilled Nursing Facility Prior Approval Number:    Date Approved/Denied:   PASRR Number: [id number]  Discharge Plan: SNF    Current Diagnoses: Patient Active Problem List   Diagnosis Date Noted   Ground-level fall 10/19/2023   Pancreatitis    MI (myocardial infarction) (HCC)    Hypertension    Hx of repair of rotator cuff    Heart murmur    GERD (gastroesophageal reflux disease)    Diabetes mellitus without complication (HCC)    Cancer (HCC)    Bereavement 05/04/2018   Insomnia due to medical condition 01/13/2017   Coronary artery disease involving native coronary artery of native heart with angina pectoris (HCC) 12/25/2014   Essential hypertension 12/25/2014   Gastroesophageal reflux disease without esophagitis 12/25/2014   Hepatic steatosis 12/25/2014   Dyslipidemia 12/25/2014   Type 2 diabetes mellitus with hyperglycemia (HCC) 12/25/2014   Generalized osteoarthritis 12/25/2014   Malignant neoplasm of left female breast (HCC) 12/25/2014   Severe obesity (BMI 35.0-39.9) with comorbidity (HCC) 12/25/2014   Type 2 diabetes mellitus with hyperglycemia, without long-term current use of insulin (HCC) 12/25/2014   Hyperlipidemia associated with type 2 diabetes mellitus (HCC) 12/25/2014   Gall bladder disease 05/31/2014    Orientation RESPIRATION BLADDER Height & Weight     Self, Time, Situation, Place  Normal Continent Weight:   Height:      BEHAVIORAL SYMPTOMS/MOOD NEUROLOGICAL BOWEL NUTRITION STATUS      Continent Diet (See DC summary)  AMBULATORY STATUS COMMUNICATION OF NEEDS Skin   Extensive Assist Verbally Normal                       Personal Care Assistance Level of Assistance  Bathing, Feeding, Dressing Bathing Assistance: Maximum assistance Feeding assistance: Limited assistance Dressing Assistance: Maximum assistance     Functional Limitations Info  Sight, Hearing, Speech Sight Info: Adequate Hearing Info: Adequate Speech Info: Adequate    SPECIAL CARE FACTORS FREQUENCY  PT (By licensed PT), OT (By licensed OT)     PT Frequency: 5x week OT Frequency: 5x week            Contractures Contractures Info: Not present    Additional Factors Info  Code Status, Allergies, Insulin Sliding Scale Code Status Info: Full Allergies Info: Atorvastatin  Metformin Hcl   Insulin Sliding Scale Info: See DC summary       Current Medications (10/19/2023):  This is the current hospital active medication list Current Facility-Administered Medications  Medication Dose Route Frequency Provider Last Rate Last Admin   acetaminophen (TYLENOL) tablet 1,000 mg  1,000 mg Oral Q6H PRN Segars, Christiane Ha, MD       aspirin EC tablet 81 mg  81 mg Oral Daily Segars, Christiane Ha, MD   81 mg at 10/19/23 1004   atorvastatin (LIPITOR) tablet 40 mg  40 mg Oral Daily Dolly Rias, MD   40 mg at 10/19/23 1004   carvedilol (COREG) tablet 25  mg  25 mg Oral BID WC Dolly Rias, MD   25 mg at 10/19/23 1004   cefTRIAXone (ROCEPHIN) 1 g in sodium chloride 0.9 % 100 mL IVPB  1 g Intravenous Q24H Dolly Rias, MD 200 mL/hr at 10/19/23 1003 1 g at 10/19/23 1003   ferrous sulfate tablet 325 mg  325 mg Oral Q breakfast Segars, Christiane Ha, MD   325 mg at 10/19/23 1012   HYDROmorphone (DILAUDID) injection 0.5 mg  0.5 mg Intravenous Q4H PRN Dolly Rias, MD       insulin aspart (novoLOG) injection 0-6 Units  0-6 Units Subcutaneous TID  WC Dolly Rias, MD   2 Units at 10/19/23 1228   insulin glargine-yfgn (SEMGLEE) injection 40 Units  40 Units Subcutaneous Daily Segars, Christiane Ha, MD   40 Units at 10/19/23 1004   oxyCODONE (Oxy IR/ROXICODONE) immediate release tablet 2.5 mg  2.5 mg Oral Q4H PRN Dolly Rias, MD   2.5 mg at 10/19/23 1459   Or   oxyCODONE (Oxy IR/ROXICODONE) immediate release tablet 5 mg  5 mg Oral Q4H PRN Segars, Christiane Ha, MD       pantoprazole (PROTONIX) EC tablet 20 mg  20 mg Oral Daily PRN Segars, Christiane Ha, MD       polyethylene glycol (MIRALAX / GLYCOLAX) packet 17 g  17 g Oral Daily PRN Dolly Rias, MD         Discharge Medications: Please see discharge summary for a list of discharge medications.  Relevant Imaging Results:  Relevant Lab Results:   Additional Information SS# [id number], [place]

## 2023-10-19 NOTE — ED Provider Notes (Signed)
[place] EMERGENCY DEPARTMENT AT [facility] Provider Note   CSN: [id number] Arrival date & time: 10/19/23  0014     History  Chief Complaint  Patient presents with   Fall / Right Knee Injury    Erika Hamilton is a 89 y.o. female.  Patient presents with right knee pain after falling this evening.  States she was walking with her cane when leaving the bathroom she tripped and stumbled and fell forward landing on her right knees.  Did hit her head but does not think she lost consciousness.  Complains of pain and swelling to her right knee.  No blood thinner use other than aspirin.  No loss of consciousness.  No chest pain or shortness of breath.  Denies any head, neck, back, chest or abdominal pain.  Does have a bloody nose but does not think she hit her nose when she fell.  Did not receive any pain medications from EMS.  Denies any preceding dizziness or lightheadedness.  Denies syncope.  The history is provided by the patient and the EMS personnel.       Home Medications Prior to Admission medications   Medication Sig Start Date End Date Taking? Authorizing Provider  aspirin EC 81 MG tablet Take 81 mg by mouth daily.    [provider]  atorvastatin (LIPITOR) 40 MG tablet Take 40 mg by mouth daily. 1 tab every other day alternating with 1/2 tab every other day.    [provider]  calcium-vitamin D (OSCAL WITH D) 500-200 MG-UNIT tablet Take 2 tablets by mouth daily with breakfast.    [provider]  carvedilol (COREG) 12.5 MG tablet Take 25 mg by mouth 2 (two) times daily with a meal.     [provider]  ferrous sulfate 325 (65 FE) MG EC tablet Take 325 mg by mouth daily with breakfast.    [provider]  ketoconazole (NIZORAL) 2 % shampoo Apply 1 application topically 2 (two) times a week. 10/11/19   [provider]  nitroGLYCERIN (NITROSTAT) 0.4 MG SL tablet Place 1 tablet (0.4 mg total) under the  tongue every 5 (five) minutes as needed for chest pain. Patient needs appointment for further refills. 1 st attempt 04/29/23   Georgeanna Lea, MD  nystatin cream (MYCOSTATIN) Apply 1 application topically 2 (two) times daily. Unknown strength    [provider]  Omega-3 Fatty Acids (FISH OIL) 1200 MG CAPS Take 1,200 mg by mouth in the morning and at bedtime.    [provider]  pantoprazole (PROTONIX) 20 MG tablet Take 20 mg by mouth daily.  05/25/14   [provider]  Probiotic Product (PROBIOTIC DAILY PO) Take 1 capsule by mouth daily after breakfast.    [provider]  Probiotic Product (PROBIOTIC DAILY PO) Take 1 tablet by mouth daily after breakfast.    [provider]  quinapril (ACCUPRIL) 40 MG tablet Take 40 mg by mouth daily.  05/11/14   [provider]  TRESIBA FLEXTOUCH 200 UNIT/ML FlexTouch Pen Inject 40 Units into the skin daily. 09/14/20   [provider]      Allergies    Atorvastatin and Metformin hcl    Review of Systems   Review of Systems  Constitutional:  Negative for activity change, appetite change and fever.  HENT:  Negative for congestion and rhinorrhea.   Respiratory:  Negative for cough, chest tightness and shortness of breath.   Cardiovascular:  Negative for chest  pain.  Gastrointestinal:  Negative for abdominal pain, nausea and vomiting.  Genitourinary:  Negative for dysuria and hematuria.  Musculoskeletal:  Positive for arthralgias and myalgias.  Skin:  Negative for rash.  Neurological:  Positive for headaches. Negative for dizziness and weakness.   all other systems are negative except as noted in the HPI and PMH.    Physical Exam Updated Vital Signs BP (!) 177/61   Pulse 71   Temp 97.9 F (36.6 C)   Resp 16   SpO2 100%  Physical Exam Vitals and nursing note reviewed.  Constitutional:      General: She is not in acute distress.    Appearance: She is well-developed.  HENT:     Head:  Normocephalic and atraumatic.     Mouth/Throat:     Pharynx: No oropharyngeal exudate.  Eyes:     Conjunctiva/sclera: Conjunctivae normal.     Pupils: Pupils are equal, round, and reactive to light.  Neck:     Comments: No Midline C spine pain.  Cardiovascular:     Rate and Rhythm: Normal rate and regular rhythm.     Heart sounds: Normal heart sounds. No murmur heard. Pulmonary:     Effort: Pulmonary effort is normal. No respiratory distress.     Breath sounds: Normal breath sounds.  Abdominal:     Palpations: Abdomen is soft.     Tenderness: There is no abdominal tenderness. There is no guarding or rebound.  Musculoskeletal:        General: Tenderness present.     Cervical back: Normal range of motion and neck supple.     Comments: Diffuse swelling of right knee anteriorly with reduced range of motion.  Able to buckle toes.  Intact DP and PT pulse.  No midline T or L-spine tenderness  Skin:    General: Skin is warm.  Neurological:     Mental Status: She is alert and oriented to person, place, and time.     Cranial Nerves: No cranial nerve deficit.     Motor: No abnormal muscle tone.     Coordination: Coordination normal.     Comments:  5/5 strength throughout. CN 2-12 intact.Equal grip strength.   Psychiatric:        Behavior: Behavior normal.     ED Results / Procedures / Treatments   Labs (all labs ordered are listed, but only abnormal results are displayed) Labs Reviewed  CBC WITH DIFFERENTIAL/PLATELET - Abnormal; Notable for the following components:      Result Value   WBC 17.6 (*)    Neutro Abs 13.3 (*)    Monocytes Absolute 1.1 (*)    Abs Immature Granulocytes 0.09 (*)    All other components within normal limits  COMPREHENSIVE METABOLIC PANEL - Abnormal; Notable for the following components:   Glucose, Bld 195 (*)    BUN 25 (*)    Creatinine, Ser 1.04 (*)    Albumin 3.2 (*)    GFR, Estimated 52 (*)    All other components within normal limits     EKG EKG Interpretation Date/Time:  Tuesday October 19 2023 00:12:36 EST Ventricular Rate:  62 PR Interval:  162 QRS Duration:  82 QT Interval:  416 QTC Calculation: 422 R Axis:   -28  Text Interpretation: Normal sinus rhythm Normal ECG No previous ECGs available No previous ECGs available Confirmed by Glynn Octave [phone]) on 10/19/2023 12:33:56 AM  Radiology CT Knee Right Wo Contrast  Result Date: 10/19/2023 CLINICAL DATA:  Knee  trauma, x-ray done, occult fracture suspected EXAM: CT OF THE RIGHT KNEE WITHOUT CONTRAST TECHNIQUE: Multidetector CT imaging of the right knee was performed according to the standard protocol. Multiplanar CT image reconstructions were also generated. RADIATION DOSE REDUCTION: This exam was performed according to the departmental dose-optimization program which includes automated exposure control, adjustment of the mA and/or kV according to patient size and/or use of iterative reconstruction technique. COMPARISON:  Same day knee radiographs FINDINGS: Bones/Joint/Cartilage No acute fracture or dislocation.  Trace knee joint effusion. Ligaments Suboptimally assessed by CT. Muscles and Tendons Intact. Soft tissues Large intermediate to high density fluid collection anterior to the patella measuring 8.9 x 3.5 x 10.9 cm. Adjacent soft tissue edema in the anterior thigh and calf. IMPRESSION: 1. Large intermediate to high density fluid collection anterior to the patella compatible with hematoma. This may be related to bursitis/bursal rupture. 2. No acute fracture. Electronically Signed   By: Minerva Fester M.D.   On: 10/19/2023 02:33   DG Hip Unilat W or Wo Pelvis 2-3 Views Right  Result Date: 10/19/2023 CLINICAL DATA:  Fall landed on right knee with knee pain. EXAM: DG HIP (WITH OR WITHOUT PELVIS) 2-3V RIGHT COMPARISON:  None Available. FINDINGS: No acute fracture or dislocation. Degenerative changes pubic symphysis, both hips, SI joints and lower lumbar spine.  IMPRESSION: No acute fracture or dislocation. Electronically Signed   By: Minerva Fester M.D.   On: 10/19/2023 02:09   DG Knee Complete 4 Views Right  Result Date: 10/19/2023 CLINICAL DATA:  Right knee pain and swelling status post fall landing on right knee EXAM: RIGHT KNEE - COMPLETE 4+ VIEW COMPARISON:  None Available. FINDINGS: No acute fracture or dislocation. Small knee joint effusion. Focal soft tissue swelling anterior to the patella. IMPRESSION: 1. No acute fracture or dislocation. 2. Focal soft tissue swelling anterior to the patella may be due to a hematoma or prepatellar bursitis. Electronically Signed   By: Minerva Fester M.D.   On: 10/19/2023 02:09   CT Head Wo Contrast  Result Date: 10/19/2023 CLINICAL DATA:  89 year old female lost of balance and fell this evening. EXAM: CT HEAD WITHOUT CONTRAST CT MAXILLOFACIAL WITHOUT CONTRAST CT CERVICAL SPINE WITHOUT CONTRAST TECHNIQUE: Multidetector CT imaging of the head, cervical spine, and maxillofacial structures were performed using the standard protocol without intravenous contrast. Multiplanar CT image reconstructions of the cervical spine and maxillofacial structures were also generated. RADIATION DOSE REDUCTION: This exam was performed according to the departmental dose-optimization program which includes automated exposure control, adjustment of the mA and/or kV according to patient size and/or use of iterative reconstruction technique. COMPARISON:  None Available. FINDINGS: CT HEAD FINDINGS Brain: No intracranial hemorrhage, mass effect, or evidence of acute infarct. No hydrocephalus. No extra-axial fluid collection. Age related cerebral atrophy and chronic small vessel ischemic disease. Vascular: No hyperdense vessel. Intracranial arterial calcification. Skull: No fracture or focal lesion. Other: None. CT MAXILLOFACIAL FINDINGS Osseous: No fracture or mandibular dislocation. No destructive process. Orbits: Negative. No traumatic or  inflammatory finding. Sinuses: Clear. Soft tissues: Negative. CT CERVICAL SPINE FINDINGS Alignment: No evidence of traumatic malalignment. Skull base and vertebrae: No acute fracture. Soft tissues and spinal canal: No prevertebral fluid or swelling. No visible canal hematoma. Disc levels: Mild multilevel spondylosis and facet arthropathy. No severe spinal canal narrowing. Upper chest: No acute abnormality. Other: Carotid calcification. IMPRESSION: 1. No acute intracranial abnormality. 2. No acute facial bone fracture. 3. No acute fracture in the cervical spine. Electronically Signed   By: Joselyn Glassman  Stutzman M.D.   On: 10/19/2023 01:21   CT Cervical Spine Wo Contrast  Result Date: 10/19/2023 CLINICAL DATA:  89 year old female lost of balance and fell this evening. EXAM: CT HEAD WITHOUT CONTRAST CT MAXILLOFACIAL WITHOUT CONTRAST CT CERVICAL SPINE WITHOUT CONTRAST TECHNIQUE: Multidetector CT imaging of the head, cervical spine, and maxillofacial structures were performed using the standard protocol without intravenous contrast. Multiplanar CT image reconstructions of the cervical spine and maxillofacial structures were also generated. RADIATION DOSE REDUCTION: This exam was performed according to the departmental dose-optimization program which includes automated exposure control, adjustment of the mA and/or kV according to patient size and/or use of iterative reconstruction technique. COMPARISON:  None Available. FINDINGS: CT HEAD FINDINGS Brain: No intracranial hemorrhage, mass effect, or evidence of acute infarct. No hydrocephalus. No extra-axial fluid collection. Age related cerebral atrophy and chronic small vessel ischemic disease. Vascular: No hyperdense vessel. Intracranial arterial calcification. Skull: No fracture or focal lesion. Other: None. CT MAXILLOFACIAL FINDINGS Osseous: No fracture or mandibular dislocation. No destructive process. Orbits: Negative. No traumatic or inflammatory finding. Sinuses:  Clear. Soft tissues: Negative. CT CERVICAL SPINE FINDINGS Alignment: No evidence of traumatic malalignment. Skull base and vertebrae: No acute fracture. Soft tissues and spinal canal: No prevertebral fluid or swelling. No visible canal hematoma. Disc levels: Mild multilevel spondylosis and facet arthropathy. No severe spinal canal narrowing. Upper chest: No acute abnormality. Other: Carotid calcification. IMPRESSION: 1. No acute intracranial abnormality. 2. No acute facial bone fracture. 3. No acute fracture in the cervical spine. Electronically Signed   By: Minerva Fester M.D.   On: 10/19/2023 01:21   CT Maxillofacial Wo Contrast  Result Date: 10/19/2023 CLINICAL DATA:  89 year old female lost of balance and fell this evening. EXAM: CT HEAD WITHOUT CONTRAST CT MAXILLOFACIAL WITHOUT CONTRAST CT CERVICAL SPINE WITHOUT CONTRAST TECHNIQUE: Multidetector CT imaging of the head, cervical spine, and maxillofacial structures were performed using the standard protocol without intravenous contrast. Multiplanar CT image reconstructions of the cervical spine and maxillofacial structures were also generated. RADIATION DOSE REDUCTION: This exam was performed according to the departmental dose-optimization program which includes automated exposure control, adjustment of the mA and/or kV according to patient size and/or use of iterative reconstruction technique. COMPARISON:  None Available. FINDINGS: CT HEAD FINDINGS Brain: No intracranial hemorrhage, mass effect, or evidence of acute infarct. No hydrocephalus. No extra-axial fluid collection. Age related cerebral atrophy and chronic small vessel ischemic disease. Vascular: No hyperdense vessel. Intracranial arterial calcification. Skull: No fracture or focal lesion. Other: None. CT MAXILLOFACIAL FINDINGS Osseous: No fracture or mandibular dislocation. No destructive process. Orbits: Negative. No traumatic or inflammatory finding. Sinuses: Clear. Soft tissues: Negative. CT  CERVICAL SPINE FINDINGS Alignment: No evidence of traumatic malalignment. Skull base and vertebrae: No acute fracture. Soft tissues and spinal canal: No prevertebral fluid or swelling. No visible canal hematoma. Disc levels: Mild multilevel spondylosis and facet arthropathy. No severe spinal canal narrowing. Upper chest: No acute abnormality. Other: Carotid calcification. IMPRESSION: 1. No acute intracranial abnormality. 2. No acute facial bone fracture. 3. No acute fracture in the cervical spine. Electronically Signed   By: Minerva Fester M.D.   On: 10/19/2023 01:21    Procedures Procedures    Medications Ordered in ED Medications  Tdap (BOOSTRIX) injection 0.5 mL (has no administration in time range)  HYDROmorphone (DILAUDID) injection 0.5 mg (has no administration in time range)    ED Course/ Medical Decision Making/ A&P  Medical Decision Making Amount and/or Complexity of Data Reviewed Independent Historian: EMS Labs: ordered. Decision-making details documented in ED Course. Radiology: ordered and independent interpretation performed. Decision-making details documented in ED Course. ECG/medicine tests: ordered and independent interpretation performed. Decision-making details documented in ED Course.  Risk Prescription drug management. Decision regarding hospitalization.   Apparent mechanical fall with right knee pain and nose injury.  No loss of consciousness.  No blood thinner use.  Complains of right knee pain only.  Neurovascularly intact with intact PT pulse.  CT head, C-spine and maxillofacial negative for fracture or acute traumatic injury.  Results reviewed interpreted by me.  Right knee x-rays negative for fracture or dislocation.  Labs show leukocytosis. CT knee obtained to rule out occult fracture given degree of swelling and hematoma.  This is negative for fracture but does show large hematoma.  Given patient's age and the fact that  she lives alone would benefit from admission for pain control, PT and OT.  Patient and daughter agreed.  Admission for pain control and therapy discussed with Dr. Lazarus Salines.  Patient given ice, elevation and Ace wrap for her knee hematoma.  No evidence of fracture or dislocation.       Final Clinical Impression(s) / ED Diagnoses Final diagnoses:  Fall, initial encounter  Traumatic hematoma of right knee, initial encounter    Rx / DC Orders ED Discharge Orders     None         Shantale Holtmeyer, Jeannett Senior, MD 10/19/23 [phone]

## 2023-10-19 NOTE — ED Notes (Signed)
ED TO INPATIENT HANDOFF REPORT  ED Nurse Name and Phone #: [phone]  S Name/Age/Gender Artelia Laroche 89 y.o. female Room/Bed: 042C/042C  Code Status   Code Status: Full Code  Home/SNF/Other Home Patient oriented to: self, place, time, and situation Is this baseline? Yes   Triage Complete: Triage complete  Chief Complaint Ground-level fall [W18.30XA]  Triage Note Patient arrived with EMS from home lost her balance and fell this evening , reports right knee pain with mild swelling . CBG= 206. Alert and oriented . No LOC . No anticoagulant medications / denies head injury .   Allergies Allergies  Allergen Reactions   Atorvastatin    Metformin Hcl     Level of Care/Admitting Diagnosis ED Disposition     ED Disposition  Admit   Condition  --   Comment  Hospital Area: [facility] [[id number]]  Level of Care: Med-Surg [16]  May place patient in observation at [facility] or [facility] if equivalent level of care is available:: Yes  Covid Evaluation: Asymptomatic - no recent exposure (last 10 days) testing not required  Diagnosis: Ground-level fall [[id number]]  Admitting Physician: Dolly Rias [[id number]]  Attending Physician: Dolly Rias [[id number]]          B Medical/Surgery History Past Medical History:  Diagnosis Date   Bereavement 05/04/2018   one of her sons was killed by a drunk driver in May 2019   Cancer [facility]    breast, tongue, skin cancer   Coronary artery disease involving native coronary artery of native heart with angina pectoris (HCC) 12/25/2014   Overview:  Severe stenosis of distal LAD & s/p NSTEMI in Sep 2015   Diabetes mellitus without complication (HCC)    Dyslipidemia 12/25/2014   Overview:  11/06/2014 - TC 165/Trig 117/HDL 53/LDL 89   Essential hypertension 12/25/2014   Gall bladder disease 05/31/2014   Gastroesophageal reflux disease without esophagitis 12/25/2014   Generalized osteoarthritis 12/25/2014   GERD  (gastroesophageal reflux disease)    Headache(784.0)    Heart murmur    Hepatic steatosis 12/25/2014   Hx of repair of rotator cuff    Hyperlipidemia associated with type 2 diabetes mellitus (HCC) 12/25/2014   Formatting of this note might be different from the original. Overview:  11/06/2014 - TC 165/Trig 117/HDL 53/LDL 89  Overview:  16/05/3709 - TC 165/Trig 117/HDL 53/LDL 89 Formatting of this note might be different from the original. 11/06/2014 - TC 165/Trig 117/HDL 53/LDL 89   Hypertension    Insomnia due to medical condition 01/13/2017   Malignant neoplasm of left female breast (HCC) 12/25/2014   MI (myocardial infarction) (HCC)    Pancreatitis    Severe obesity (BMI 35.0-39.9) with comorbidity (HCC) 12/25/2014   Type 2 diabetes mellitus with hyperglycemia (HCC) 12/25/2014   Overview:  11/06/2014 HgbA1C was 8.0% (Micral/Creat ratio same date was normal at 7.7 - < 30)   Type 2 diabetes mellitus with hyperglycemia, without long-term current use of insulin (HCC) 12/25/2014   11/06/2014 HgbA1C was 8.0% (Micral/Creat ratio same date was normal at 7.7 - < 30)   Past Surgical History:  Procedure Laterality Date   BREAST SURGERY Left    lumpectomy   CHOLECYSTECTOMY N/A 07/09/2014   Procedure: LAPAROSCOPIC CHOLECYSTECTOMY WITH INTRAOPERATIVE CHOLANGIOGRAM;  Surgeon: Kandis Cocking, MD;  Location: [facility] OR;  Service: General;  Laterality: N/A;   CORONARY ANGIOPLASTY     HERNIA REPAIR     umbilical   SHOULDER SURGERY  TONGUE SURGERY     removal of cancerous mass   TONSILLECTOMY       A IV Location/Drains/Wounds Patient Lines/Drains/Airways Status     Active Line/Drains/Airways     Name Placement date Placement time Site Days   Peripheral IV 10/19/23 18 G Left Antecubital 10/19/23  0037  Antecubital  less than 1   Incision (Closed) 07/09/14 Abdomen Other (Comment) 07/09/14  0921  -- 3389   Incision - 5 Ports Abdomen 1: Umbilicus 2: Mid;Upper 3: Right;Lower 4: Right;Mid 5: Right;Upper 07/09/14   --  -- 3389            Intake/Output Last 24 hours No intake or output data in the 24 hours ending 10/19/23 0459  Labs/Imaging Results for orders placed or performed during the hospital encounter of 10/19/23 (from the past 48 hour(s))  CBC with Differential     Status: Abnormal   Collection Time: 10/19/23 12:36 AM  Result Value Ref Range   WBC 17.6 (H) 4.0 - 10.5 K/uL   RBC 4.30 3.87 - 5.11 MIL/uL   Hemoglobin 12.4 12.0 - 15.0 g/dL   HCT 40.9 81.1 - 91.4 %   MCV 92.1 80.0 - 100.0 fL   MCH 28.8 26.0 - 34.0 pg   MCHC 31.3 30.0 - 36.0 g/dL   RDW 78.2 95.6 - 21.3 %   Platelets 285 150 - 400 K/uL   nRBC 0.0 0.0 - 0.2 %   Neutrophils Relative % 74 %   Neutro Abs 13.3 (H) 1.7 - 7.7 K/uL   Lymphocytes Relative 16 %   Lymphs Abs 2.8 0.7 - 4.0 K/uL   Monocytes Relative 6 %   Monocytes Absolute 1.1 (H) 0.1 - 1.0 K/uL   Eosinophils Relative 2 %   Eosinophils Absolute 0.3 0.0 - 0.5 K/uL   Basophils Relative 1 %   Basophils Absolute 0.1 0.0 - 0.1 K/uL   Immature Granulocytes 1 %   Abs Immature Granulocytes 0.09 (H) 0.00 - 0.07 K/uL    Comment: Performed at [facility], [address], [place], [place] [address]  Comprehensive metabolic panel     Status: Abnormal   Collection Time: 10/19/23 12:36 AM  Result Value Ref Range   Sodium 138 135 - 145 mmol/L   Potassium 4.5 3.5 - 5.1 mmol/L   Chloride 106 98 - 111 mmol/L   CO2 25 22 - 32 mmol/L   Glucose, Bld 195 (H) 70 - 99 mg/dL    Comment: Glucose reference range applies only to samples taken after fasting for at least 8 hours.   BUN 25 (H) 8 - 23 mg/dL   Creatinine, Ser 8.46 (H) 0.44 - 1.00 mg/dL   Calcium 9.1 8.9 - 96.2 mg/dL   Total Protein 6.8 6.5 - 8.1 g/dL   Albumin 3.2 (L) 3.5 - 5.0 g/dL   AST 16 15 - 41 U/L   ALT 12 0 - 44 U/L   Alkaline Phosphatase 61 38 - 126 U/L   Total Bilirubin 0.6 <1.2 mg/dL   GFR, Estimated 52 (L) >60 mL/min    Comment: (NOTE) Calculated using the CKD-EPI Creatinine Equation (2021)     Anion gap 7 5 - 15    Comment: Performed at [facility], [address] [place], [place] [address]  Urinalysis, Routine w reflex microscopic -Urine, Clean Catch     Status: Abnormal   Collection Time: 10/19/23  4:01 AM  Result Value Ref Range   Color, Urine YELLOW YELLOW  APPearance HAZY (A) CLEAR   Specific Gravity, Urine 1.023 1.005 - 1.030   pH 5.0 5.0 - 8.0   Glucose, UA 50 (A) NEGATIVE mg/dL   Hgb urine dipstick NEGATIVE NEGATIVE   Bilirubin Urine NEGATIVE NEGATIVE   Ketones, ur NEGATIVE NEGATIVE mg/dL   Protein, ur NEGATIVE NEGATIVE mg/dL   Nitrite NEGATIVE NEGATIVE   Leukocytes,Ua MODERATE (A) NEGATIVE   RBC / HPF 0-5 0 - 5 RBC/hpf   WBC, UA 11-20 0 - 5 WBC/hpf   Bacteria, UA RARE (A) NONE SEEN   Squamous Epithelial / HPF 0-5 0 - 5 /HPF   Mucus PRESENT    Hyaline Casts, UA PRESENT     Comment: Performed at [facility], [address], [place], [place] [address]   DG CHEST PORT 1 VIEW  Result Date: 10/19/2023 CLINICAL DATA:  Leukocytosis EXAM: PORTABLE CHEST 1 VIEW COMPARISON:  07/27/2014 FINDINGS: A small infiltrate is possible in the left suprahilar lung. No edema, effusion, or pneumothorax. Normal heart size and mediastinal contours. No visible effusion or pneumothorax. Postoperative left axilla. IMPRESSION: Possible small left suprahilar pneumonia. Electronically Signed   By: Tiburcio Pea M.D.   On: 10/19/2023 04:53   CT Knee Right Wo Contrast  Result Date: 10/19/2023 CLINICAL DATA:  Knee trauma, x-ray done, occult fracture suspected EXAM: CT OF THE RIGHT KNEE WITHOUT CONTRAST TECHNIQUE: Multidetector CT imaging of the right knee was performed according to the standard protocol. Multiplanar CT image reconstructions were also generated. RADIATION DOSE REDUCTION: This exam was performed according to the departmental dose-optimization program which includes automated exposure control, adjustment of the mA and/or kV according to patient size and/or use of  iterative reconstruction technique. COMPARISON:  Same day knee radiographs FINDINGS: Bones/Joint/Cartilage No acute fracture or dislocation.  Trace knee joint effusion. Ligaments Suboptimally assessed by CT. Muscles and Tendons Intact. Soft tissues Large intermediate to high density fluid collection anterior to the patella measuring 8.9 x 3.5 x 10.9 cm. Adjacent soft tissue edema in the anterior thigh and calf. IMPRESSION: 1. Large intermediate to high density fluid collection anterior to the patella compatible with hematoma. This may be related to bursitis/bursal rupture. 2. No acute fracture. Electronically Signed   By: Minerva Fester M.D.   On: 10/19/2023 02:33   DG Hip Unilat W or Wo Pelvis 2-3 Views Right  Result Date: 10/19/2023 CLINICAL DATA:  Fall landed on right knee with knee pain. EXAM: DG HIP (WITH OR WITHOUT PELVIS) 2-3V RIGHT COMPARISON:  None Available. FINDINGS: No acute fracture or dislocation. Degenerative changes pubic symphysis, both hips, SI joints and lower lumbar spine. IMPRESSION: No acute fracture or dislocation. Electronically Signed   By: Minerva Fester M.D.   On: 10/19/2023 02:09   DG Knee Complete 4 Views Right  Result Date: 10/19/2023 CLINICAL DATA:  Right knee pain and swelling status post fall landing on right knee EXAM: RIGHT KNEE - COMPLETE 4+ VIEW COMPARISON:  None Available. FINDINGS: No acute fracture or dislocation. Small knee joint effusion. Focal soft tissue swelling anterior to the patella. IMPRESSION: 1. No acute fracture or dislocation. 2. Focal soft tissue swelling anterior to the patella may be due to a hematoma or prepatellar bursitis. Electronically Signed   By: Minerva Fester M.D.   On: 10/19/2023 02:09   CT Head Wo Contrast  Result Date: 10/19/2023 CLINICAL DATA:  89 year old female lost of balance and fell this evening. EXAM: CT HEAD WITHOUT CONTRAST CT MAXILLOFACIAL WITHOUT CONTRAST CT CERVICAL SPINE WITHOUT CONTRAST TECHNIQUE:  Multidetector CT  imaging of the head, cervical spine, and maxillofacial structures were performed using the standard protocol without intravenous contrast. Multiplanar CT image reconstructions of the cervical spine and maxillofacial structures were also generated. RADIATION DOSE REDUCTION: This exam was performed according to the departmental dose-optimization program which includes automated exposure control, adjustment of the mA and/or kV according to patient size and/or use of iterative reconstruction technique. COMPARISON:  None Available. FINDINGS: CT HEAD FINDINGS Brain: No intracranial hemorrhage, mass effect, or evidence of acute infarct. No hydrocephalus. No extra-axial fluid collection. Age related cerebral atrophy and chronic small vessel ischemic disease. Vascular: No hyperdense vessel. Intracranial arterial calcification. Skull: No fracture or focal lesion. Other: None. CT MAXILLOFACIAL FINDINGS Osseous: No fracture or mandibular dislocation. No destructive process. Orbits: Negative. No traumatic or inflammatory finding. Sinuses: Clear. Soft tissues: Negative. CT CERVICAL SPINE FINDINGS Alignment: No evidence of traumatic malalignment. Skull base and vertebrae: No acute fracture. Soft tissues and spinal canal: No prevertebral fluid or swelling. No visible canal hematoma. Disc levels: Mild multilevel spondylosis and facet arthropathy. No severe spinal canal narrowing. Upper chest: No acute abnormality. Other: Carotid calcification. IMPRESSION: 1. No acute intracranial abnormality. 2. No acute facial bone fracture. 3. No acute fracture in the cervical spine. Electronically Signed   By: Minerva Fester M.D.   On: 10/19/2023 01:21   CT Cervical Spine Wo Contrast  Result Date: 10/19/2023 CLINICAL DATA:  89 year old female lost of balance and fell this evening. EXAM: CT HEAD WITHOUT CONTRAST CT MAXILLOFACIAL WITHOUT CONTRAST CT CERVICAL SPINE WITHOUT CONTRAST TECHNIQUE: Multidetector CT imaging of the head, cervical  spine, and maxillofacial structures were performed using the standard protocol without intravenous contrast. Multiplanar CT image reconstructions of the cervical spine and maxillofacial structures were also generated. RADIATION DOSE REDUCTION: This exam was performed according to the departmental dose-optimization program which includes automated exposure control, adjustment of the mA and/or kV according to patient size and/or use of iterative reconstruction technique. COMPARISON:  None Available. FINDINGS: CT HEAD FINDINGS Brain: No intracranial hemorrhage, mass effect, or evidence of acute infarct. No hydrocephalus. No extra-axial fluid collection. Age related cerebral atrophy and chronic small vessel ischemic disease. Vascular: No hyperdense vessel. Intracranial arterial calcification. Skull: No fracture or focal lesion. Other: None. CT MAXILLOFACIAL FINDINGS Osseous: No fracture or mandibular dislocation. No destructive process. Orbits: Negative. No traumatic or inflammatory finding. Sinuses: Clear. Soft tissues: Negative. CT CERVICAL SPINE FINDINGS Alignment: No evidence of traumatic malalignment. Skull base and vertebrae: No acute fracture. Soft tissues and spinal canal: No prevertebral fluid or swelling. No visible canal hematoma. Disc levels: Mild multilevel spondylosis and facet arthropathy. No severe spinal canal narrowing. Upper chest: No acute abnormality. Other: Carotid calcification. IMPRESSION: 1. No acute intracranial abnormality. 2. No acute facial bone fracture. 3. No acute fracture in the cervical spine. Electronically Signed   By: Minerva Fester M.D.   On: 10/19/2023 01:21   CT Maxillofacial Wo Contrast  Result Date: 10/19/2023 CLINICAL DATA:  89 year old female lost of balance and fell this evening. EXAM: CT HEAD WITHOUT CONTRAST CT MAXILLOFACIAL WITHOUT CONTRAST CT CERVICAL SPINE WITHOUT CONTRAST TECHNIQUE: Multidetector CT imaging of the head, cervical spine, and maxillofacial structures  were performed using the standard protocol without intravenous contrast. Multiplanar CT image reconstructions of the cervical spine and maxillofacial structures were also generated. RADIATION DOSE REDUCTION: This exam was performed according to the departmental dose-optimization program which includes automated exposure control, adjustment of the mA and/or kV according to patient size and/or use of iterative  reconstruction technique. COMPARISON:  None Available. FINDINGS: CT HEAD FINDINGS Brain: No intracranial hemorrhage, mass effect, or evidence of acute infarct. No hydrocephalus. No extra-axial fluid collection. Age related cerebral atrophy and chronic small vessel ischemic disease. Vascular: No hyperdense vessel. Intracranial arterial calcification. Skull: No fracture or focal lesion. Other: None. CT MAXILLOFACIAL FINDINGS Osseous: No fracture or mandibular dislocation. No destructive process. Orbits: Negative. No traumatic or inflammatory finding. Sinuses: Clear. Soft tissues: Negative. CT CERVICAL SPINE FINDINGS Alignment: No evidence of traumatic malalignment. Skull base and vertebrae: No acute fracture. Soft tissues and spinal canal: No prevertebral fluid or swelling. No visible canal hematoma. Disc levels: Mild multilevel spondylosis and facet arthropathy. No severe spinal canal narrowing. Upper chest: No acute abnormality. Other: Carotid calcification. IMPRESSION: 1. No acute intracranial abnormality. 2. No acute facial bone fracture. 3. No acute fracture in the cervical spine. Electronically Signed   By: Minerva Fester M.D.   On: 10/19/2023 01:21    Pending Labs Unresulted Labs (From admission, onward)    None       Vitals/Pain Today's Vitals   10/19/23 0213 10/19/23 0217 10/19/23 0412 10/19/23 0413  BP:  (!) 140/55 (!) 133/52   Pulse:  70 71   Resp:  16 16   Temp:  97.9 F (36.6 C) 98 F (36.7 C)   TempSrc:  Oral Oral   SpO2:  100% 100%   PainSc: 3    5     Isolation  Precautions No active isolations  Medications Medications  acetaminophen (TYLENOL) tablet 1,000 mg (has no administration in time range)  oxyCODONE (Oxy IR/ROXICODONE) immediate release tablet 2.5 mg (has no administration in time range)    Or  oxyCODONE (Oxy IR/ROXICODONE) immediate release tablet 5 mg (has no administration in time range)  polyethylene glycol (MIRALAX / GLYCOLAX) packet 17 g (has no administration in time range)  Tdap (BOOSTRIX) injection 0.5 mL ( Intramuscular Not Given 10/19/23 0403)  HYDROmorphone (DILAUDID) injection 0.5 mg (0.5 mg Intravenous Given 10/19/23 0051)  HYDROmorphone (DILAUDID) injection 0.5 mg (0.5 mg Intravenous Given 10/19/23 0409)    Mobility walks with person assist     Focused Assessments Swelling right knee with hematoma, joint effusion and bursitis   R Recommendations: See Admitting Provider Note  Report given to:   Additional Notes: [phone]

## 2023-10-19 NOTE — Progress Notes (Signed)
Per H&P, history of breast cancer/left lumpectomy. PIV present in left AC. Per Susa Griffins, MD okay for PIV to remain in left [facility].

## 2023-10-19 NOTE — Consult Note (Signed)
Reason for Consult:right knee injury Referring Physician: Heron Nay, MD (Hospitalist)  Erika Hamilton is an 89 y.o. female.  HPI: 89 year old female with history of diabetes mellitus, hypertension, hyperlipidemia, CAD, obesity, breast cancer, squamous cell carcinoma of the tongue is brought to the emergency department by EMS after a fall at home while getting out of the bathroom, tripped and fell. Denied any loss of consciousness. Patient fell forward landing on her right knee and hit her head, followed by nosebleed. CT head without contrast did not show any acute intracranial abnormality. CT C-spine, maxillofacial, x-ray of the hip negative for any acute injuries. CT of the right knee showed large anterior patellar hematoma related to bursal rupture, bursitis.   Past Medical History:  Diagnosis Date   Bereavement 05/04/2018   one of her sons was killed by a drunk driver in May 2019   Cancer [facility])    breast, tongue, skin cancer   Coronary artery disease involving native coronary artery of native heart with angina pectoris (HCC) 12/25/2014   Overview:  Severe stenosis of distal LAD & s/p NSTEMI in Sep 2015   Diabetes mellitus without complication (HCC)    Dyslipidemia 12/25/2014   Overview:  11/06/2014 - TC 165/Trig 117/HDL 53/LDL 89   Essential hypertension 12/25/2014   Gall bladder disease 05/31/2014   Gastroesophageal reflux disease without esophagitis 12/25/2014   Generalized osteoarthritis 12/25/2014   GERD (gastroesophageal reflux disease)    Headache(784.0)    Heart murmur    Hepatic steatosis 12/25/2014   Hx of repair of rotator cuff    Hyperlipidemia associated with type 2 diabetes mellitus (HCC) 12/25/2014   Formatting of this note might be different from the original. Overview:  11/06/2014 - TC 165/Trig 117/HDL 53/LDL 89  Overview:  63/87/5643 - TC 165/Trig 117/HDL 53/LDL 89 Formatting of this note might be different from the original. 11/06/2014 - TC 165/Trig 117/HDL 53/LDL 89    Hypertension    Insomnia due to medical condition 01/13/2017   Malignant neoplasm of left female breast (HCC) 12/25/2014   MI (myocardial infarction) (HCC)    Pancreatitis    Severe obesity (BMI 35.0-39.9) with comorbidity (HCC) 12/25/2014   Type 2 diabetes mellitus with hyperglycemia (HCC) 12/25/2014   Overview:  11/06/2014 HgbA1C was 8.0% (Micral/Creat ratio same date was normal at 7.7 - < 30)   Type 2 diabetes mellitus with hyperglycemia, without long-term current use of insulin (HCC) 12/25/2014   11/06/2014 HgbA1C was 8.0% (Micral/Creat ratio same date was normal at 7.7 - < 30)    Past Surgical History:  Procedure Laterality Date   BREAST SURGERY Left    lumpectomy   CHOLECYSTECTOMY N/A 07/09/2014   Procedure: LAPAROSCOPIC CHOLECYSTECTOMY WITH INTRAOPERATIVE CHOLANGIOGRAM;  Surgeon: Kandis Cocking, MD;  Location: [facility] OR;  Service: General;  Laterality: N/A;   CORONARY ANGIOPLASTY     HERNIA REPAIR     umbilical   SHOULDER SURGERY     TONGUE SURGERY     removal of cancerous mass   TONSILLECTOMY      Family History  Problem Relation Age of Onset   Diabetes Mother    Coronary artery disease Father    Diabetes Maternal Aunt    Diabetes Maternal Uncle     Social History:  reports that she has never smoked. She has never used smokeless tobacco. She reports that she does not drink alcohol and does not use drugs.  Allergies:  Allergies  Allergen Reactions   Atorvastatin     myalgia  Metformin Hcl     GI upset    Medications: I have reviewed the patient's current medications. Scheduled:  aspirin EC  81 mg Oral Daily   atorvastatin  40 mg Oral Daily   carvedilol  25 mg Oral BID WC   ferrous sulfate  325 mg Oral Q breakfast   insulin aspart  0-6 Units Subcutaneous TID WC   insulin glargine-yfgn  40 Units Subcutaneous Daily    Results for orders placed or performed during the hospital encounter of 10/19/23 (from the past 24 hour(s))  CBC with Differential     Status: Abnormal    Collection Time: 10/19/23 12:36 AM  Result Value Ref Range   WBC 17.6 (H) 4.0 - 10.5 K/uL   RBC 4.30 3.87 - 5.11 MIL/uL   Hemoglobin 12.4 12.0 - 15.0 g/dL   HCT 16.1 09.6 - 04.5 %   MCV 92.1 80.0 - 100.0 fL   MCH 28.8 26.0 - 34.0 pg   MCHC 31.3 30.0 - 36.0 g/dL   RDW 40.9 81.1 - 91.4 %   Platelets 285 150 - 400 K/uL   nRBC 0.0 0.0 - 0.2 %   Neutrophils Relative % 74 %   Neutro Abs 13.3 (H) 1.7 - 7.7 K/uL   Lymphocytes Relative 16 %   Lymphs Abs 2.8 0.7 - 4.0 K/uL   Monocytes Relative 6 %   Monocytes Absolute 1.1 (H) 0.1 - 1.0 K/uL   Eosinophils Relative 2 %   Eosinophils Absolute 0.3 0.0 - 0.5 K/uL   Basophils Relative 1 %   Basophils Absolute 0.1 0.0 - 0.1 K/uL   Immature Granulocytes 1 %   Abs Immature Granulocytes 0.09 (H) 0.00 - 0.07 K/uL  Comprehensive metabolic panel     Status: Abnormal   Collection Time: 10/19/23 12:36 AM  Result Value Ref Range   Sodium 138 135 - 145 mmol/L   Potassium 4.5 3.5 - 5.1 mmol/L   Chloride 106 98 - 111 mmol/L   CO2 25 22 - 32 mmol/L   Glucose, Bld 195 (H) 70 - 99 mg/dL   BUN 25 (H) 8 - 23 mg/dL   Creatinine, Ser 7.82 (H) 0.44 - 1.00 mg/dL   Calcium 9.1 8.9 - 95.6 mg/dL   Total Protein 6.8 6.5 - 8.1 g/dL   Albumin 3.2 (L) 3.5 - 5.0 g/dL   AST 16 15 - 41 U/L   ALT 12 0 - 44 U/L   Alkaline Phosphatase 61 38 - 126 U/L   Total Bilirubin 0.6 <1.2 mg/dL   GFR, Estimated 52 (L) >60 mL/min   Anion gap 7 5 - 15  Urinalysis, Routine w reflex microscopic -Urine, Clean Catch     Status: Abnormal   Collection Time: 10/19/23  4:01 AM  Result Value Ref Range   Color, Urine YELLOW YELLOW   APPearance HAZY (A) CLEAR   Specific Gravity, Urine 1.023 1.005 - 1.030   pH 5.0 5.0 - 8.0   Glucose, UA 50 (A) NEGATIVE mg/dL   Hgb urine dipstick NEGATIVE NEGATIVE   Bilirubin Urine NEGATIVE NEGATIVE   Ketones, ur NEGATIVE NEGATIVE mg/dL   Protein, ur NEGATIVE NEGATIVE mg/dL   Nitrite NEGATIVE NEGATIVE   Leukocytes,Ua MODERATE (A) NEGATIVE   RBC /  HPF 0-5 0 - 5 RBC/hpf   WBC, UA 11-20 0 - 5 WBC/hpf   Bacteria, UA RARE (A) NONE SEEN   Squamous Epithelial / HPF 0-5 0 - 5 /HPF   Mucus PRESENT    Hyaline Casts,  UA PRESENT   Glucose, capillary     Status: Abnormal   Collection Time: 10/19/23  7:44 AM  Result Value Ref Range   Glucose-Capillary 150 (H) 70 - 99 mg/dL  Glucose, capillary     Status: Abnormal   Collection Time: 10/19/23 11:47 AM  Result Value Ref Range   Glucose-Capillary 231 (H) 70 - 99 mg/dL  Glucose, capillary     Status: Abnormal   Collection Time: 10/19/23  4:38 PM  Result Value Ref Range   Glucose-Capillary 160 (H) 70 - 99 mg/dL     X-ray: CLINICAL DATA:  Right knee pain and swelling status post fall landing on right knee   EXAM: RIGHT KNEE - COMPLETE 4+ VIEW   COMPARISON:  None Available.   FINDINGS: No acute fracture or dislocation. Small knee joint effusion. Focal soft tissue swelling anterior to the patella.   IMPRESSION: 1. No acute fracture or dislocation. 2. Focal soft tissue swelling anterior to the patella may be due to a hematoma or prepatellar bursitis.     Electronically Signed   By: Minerva Fester M.D.  CLINICAL DATA:  Knee trauma, x-ray done, occult fracture suspected   EXAM: CT OF THE RIGHT KNEE WITHOUT CONTRAST   TECHNIQUE: Multidetector CT imaging of the right knee was performed according to the standard protocol. Multiplanar CT image reconstructions were also generated.   RADIATION DOSE REDUCTION: This exam was performed according to the departmental dose-optimization program which includes automated exposure control, adjustment of the mA and/or kV according to patient size and/or use of iterative reconstruction technique.   COMPARISON:  Same day knee radiographs   FINDINGS: Bones/Joint/Cartilage   No acute fracture or dislocation.  Trace knee joint effusion.   Ligaments   Suboptimally assessed by CT.   Muscles and Tendons   Intact.   Soft tissues    Large intermediate to high density fluid collection anterior to the patella measuring 8.9 x 3.5 x 10.9 cm. Adjacent soft tissue edema in the anterior thigh and calf.   IMPRESSION: 1. Large intermediate to high density fluid collection anterior to the patella compatible with hematoma. This may be related to bursitis/bursal rupture. 2. No acute fracture.     Electronically Signed   By: Minerva Fester M.D.  ROS: AS noted in admitting HPI  Blood pressure (!) 138/42, pulse 72, temperature 98.3 F (36.8 C), temperature source Oral, resp. rate 18, SpO2 99%.  Physical Exam: Gen: Awake, alert, NAD CV: Regular, normal S1, S2, no murmurs  Resp: Normal WOB, CTAB  Abd: Flat, normoactive, nontender MSK: Right knee with prepatellar swelling and ecchymosis.  No abrasions or significant tension on her skin. Pain with weight bearing and motion  Small blister medially Distally neurovascularly intact. Skin: Ecchymosis over the left eyebrow, see MSK for additional findings Neuro: Alert and interactive  Psych: euthymic, appropriate  Assessment/Plan: Right knee contusion with prepatellar hematoma  Plan: No acute fracture WBAT RLE ICE and compression to help with the hematoma Can follow up with [place] on an as needed basis for persistent concerns about swelling/bruising  Shelda Pal 10/19/2023, 6:04 PM

## 2023-10-19 NOTE — Care Management Obs Status (Signed)
MEDICARE OBSERVATION STATUS NOTIFICATION   Patient Details  Name: Erika Hamilton MRN: [id number] Date of Birth: 12-24-1932   Medicare Observation Status Notification Given:  Yes    Kingsley Plan, RN 10/19/2023, 1:35 PM

## 2023-10-20 DIAGNOSIS — I10 Essential (primary) hypertension: Secondary | ICD-10-CM | POA: Diagnosis not present

## 2023-10-20 DIAGNOSIS — M7041 Prepatellar bursitis, right knee: Secondary | ICD-10-CM | POA: Diagnosis not present

## 2023-10-20 DIAGNOSIS — W1830XA Fall on same level, unspecified, initial encounter: Secondary | ICD-10-CM | POA: Diagnosis not present

## 2023-10-20 LAB — GLUCOSE, CAPILLARY
Glucose-Capillary: 227 mg/dL — ABNORMAL HIGH (ref 70–99)
Glucose-Capillary: 222 mg/dL — ABNORMAL HIGH (ref 70–99)
Glucose-Capillary: 203 mg/dL — ABNORMAL HIGH (ref 70–99)
Glucose-Capillary: 199 mg/dL — ABNORMAL HIGH (ref 70–99)

## 2023-10-20 NOTE — Progress Notes (Signed)
Physical Therapy Treatment Patient Details Name: Erika Hamilton MRN: [id number] DOB: [birth year] Today's Date: 10/20/2023   History of Present Illness Pt is a 89 y.o. F who presents 10/19/2023 after a fall at home. Found to have right knee prepatellar hematoma, question bursal rupture. Significant PMH: diabetes, HTN, CAD, obesity, breast CA, squamous cell carcinoma tongue.    PT Comments  Pt progressing steadily towards her physical therapy goals; premedicated prior to session. Received up in chair. Requiring moderate assist for transfers and ambulating 30 ft with a walker at a min assist level. Continues with R knee pain, weakness, gait abnormalities, impaired standing balance. Drop in BP noted from sitting to standing, but not considered positive for threshold of orthostatic hypotension. Patient will benefit from continued inpatient follow up therapy, <3 hours/day.  Vitals: Sitting: 132/62 (78) Standing: 113/73 Supine post walk: 138/58 (78)    If plan is discharge home, recommend the following: A lot of help with walking and/or transfers;A lot of help with bathing/dressing/bathroom   Can travel by private vehicle     No  Equipment Recommendations  Rolling walker (2 wheels);BSC/3in1;Wheelchair cushion (measurements PT);Wheelchair (measurements PT)    Recommendations for Other Services       Precautions / Restrictions Precautions Precautions: Fall;Other (comment) Precaution Comments: watch BP Restrictions Weight Bearing Restrictions: No     Mobility  Bed Mobility Overal bed mobility: Needs Assistance Bed Mobility: Sit to Supine       Sit to supine: Mod assist   General bed mobility comments: Assist for BLE's back into bed and scooting up with use of bed pad    Transfers Overall transfer level: Needs assistance Equipment used: Rolling walker (2 wheels) Transfers: Sit to/from Stand Sit to Stand: Mod assist, +2 physical assistance           General  transfer comment: modA + 2 to rise from chair    Ambulation/Gait Ambulation/Gait assistance: Min assist, +2 safety/equipment Gait Distance (Feet): 30 Feet Assistive device: Rolling walker (2 wheels) Gait Pattern/deviations: Step-to pattern, Decreased stance time - right, Decreased weight shift to right Gait velocity: decreased Gait velocity interpretation: <1.31 ft/sec, indicative of household ambulator   General Gait Details: Verbal cues for sequencing/technique   Stairs             Wheelchair Mobility     Tilt Bed    Modified Rankin (Stroke Patients Only)       Balance Overall balance assessment: Needs assistance Sitting-balance support: Feet supported Sitting balance-Leahy Scale: Fair     Standing balance support: Bilateral upper extremity supported, Reliant on assistive device for balance Standing balance-Leahy Scale: Poor                              Cognition Arousal: Alert Behavior During Therapy: WFL for tasks assessed/performed Overall Cognitive Status: Within Functional Limits for tasks assessed                                          Exercises General Exercises - Lower Extremity Ankle Circles/Pumps: Both, 20 reps, Seated Quad Sets: Both, 10 reps, Seated    General Comments        Pertinent Vitals/Pain Pain Assessment Pain Assessment: Faces Faces Pain Scale: Hurts even more Pain Location: R knee Pain Descriptors / Indicators: Discomfort, Grimacing, Guarding Pain Intervention(s): Limited activity within  patient's tolerance, Monitored during session, Premedicated before session    Home Living                          Prior Function            PT Goals (current goals can now be found in the care plan section) Acute Rehab PT Goals Patient Stated Goal: return to baseline PT Goal Formulation: With patient Time For Goal Achievement: 11/02/23 Potential to Achieve Goals: Good Progress towards PT  goals: Progressing toward goals    Frequency    Min 1X/week      PT Plan      Co-evaluation              AM-PAC PT "6 Clicks" Mobility   Outcome Measure  Help needed turning from your back to your side while in a flat bed without using bedrails?: A Lot Help needed moving from lying on your back to sitting on the side of a flat bed without using bedrails?: A Lot Help needed moving to and from a bed to a chair (including a wheelchair)?: A Little Help needed standing up from a chair using your arms (e.g., wheelchair or bedside chair)?: A Lot Help needed to walk in hospital room?: A Little Help needed climbing 3-5 steps with a railing? : Total 6 Click Score: 13    End of Session Equipment Utilized During Treatment: Gait belt Activity Tolerance: Patient tolerated treatment well Patient left: in bed;with call bell/phone within reach;with bed alarm set Nurse Communication: Mobility status PT Visit Diagnosis: Other abnormalities of gait and mobility (R26.89);History of falling (Z91.81);Difficulty in walking, not elsewhere classified (R26.2);Pain Pain - Right/Left: Right Pain - part of body: Knee     Time: 1610-9604 PT Time Calculation (min) (ACUTE ONLY): 26 min  Charges:    $Gait Training: 8-22 mins $Therapeutic Activity: 8-22 mins PT General Charges $$ ACUTE PT VISIT: 1 Visit                     Lillia Pauls, PT, DPT Acute Rehabilitation Services Office [phone]    Erika Hamilton 10/20/2023, 12:52 PM

## 2023-10-20 NOTE — Progress Notes (Signed)
OT NOTE  Pt just finishing mobility tech movement and prior to that PT session. Pt politely declined additional mobility at this time. OT assisted with notifying RN glucose meter reading 241 in the room with patient requesting prune juice. Pt advised to hold on any prune juice due to glucose levels elevated at this time until RN advises differently. OT provided ice for R LE and slight elevation. Pt educated on edema management. Pt pending [facility] [place] and notes confirm.    Timmothy Euler, OTR/L  Acute Rehabilitation Services Office: [phone] .

## 2023-10-20 NOTE — Plan of Care (Signed)
  Problem: Elimination: Goal: Will not experience complications related to urinary retention Outcome: Progressing   Problem: Pain Management: Goal: General experience of comfort will improve Outcome: Progressing   Problem: Safety: Goal: Ability to remain free from injury will improve Outcome: Progressing

## 2023-10-20 NOTE — Progress Notes (Signed)
  Progress Note   Patient: Erika Hamilton NWG:[id number] DOB: [birth year] DOA: 10/19/2023     0 DOS: the patient was seen and examined on 10/20/2023   Brief hospital course: No notes on file  Assessment and Plan:  89 year old female with history of diabetes mellitus, hypertension, hyperlipidemia, CAD, obesity, breast cancer, squamous cell carcinoma of the tongue is brought to the emergency department by EMS after a fall at home while getting out of the bathroom, tripped and fell.  Denied any loss of consciousness.  Patient fell forward landing on her right knee and hit her head, followed by nosebleed.  CT head without contrast did not show any acute intracranial abnormality.  CT C-spine, maxillofacial, x-ray of the hip negative for any acute injuries.  CT of the right knee showed large anterior patellar hematoma related to bursal rupture, bursitis.  Lab workup is unremarkable  Right knee prepatellar hematoma -Continue with pain as needed -Apply ice, rest  -Appreciate orthopedic surgery evaluation and recommendation -Evaluated by physical therapy-recommend SNF -Consult case management for assistance for placement to SNF  Hypertension accelerated -Secondary to pain -Continue with pain management as needed -Continue with amlodipine -Continues to have improvement  Urinary tract infection -Follow-up with urine cultures -Continue with Rocephin  Diabetes mellitus -Get hemoglobin A1c -Follow-up on sliding scale insulin  Subjective:  complaining of pain in the right knee  Physical Exam: Vitals:   10/20/23 0519 10/20/23 0521 10/20/23 0628 10/20/23 0736  BP: (!) 145/64 97/73  (!) 138/52  Pulse: 83 84  75  Resp:    16  Temp:    97.8 F (36.6 C)  TempSrc:    Oral  SpO2:    99%  Weight:   77.1 kg   Height:   5' (1.524 m)    Gen: Awake, alert, NAD CV: Regular, normal S1, S2, no murmurs  Resp: Normal WOB, CTAB  Abd: Flat, normoactive, nontender MSK: Right knee has an Ace  wrap in place, large effusion over the right knee, tenderness along the joint line as well, limited active and passive range of motion secondary to pain.  Distally neurovascularly intact. Skin: Ecchymosis over the left eyebrow, see MSK for additional findings Neuro: Alert and interactive  Psych: euthymic, appropriate   Family Communication:  patient's son, patient  Disposition: Status is: Observation The patient will require care spanning > 2 midnights and should be moved to inpatient because:  severe right  knee pain  Planned Discharge Destination:  based on physical therapy evaluation  Time spent:  40 minutes  Author: Susa Griffins, MD 10/20/2023 8:53 AM  For on call review [email or web address].

## 2023-10-20 NOTE — TOC Progression Note (Addendum)
Transition of Care [facility]) - Progression Note    Patient Details  Name: Erika Hamilton MRN: [id number] Date of Birth: [birth year]  [facility]) CM/SW Contact  Carley Hammed, LCSW Phone Number: 10/20/2023, 10:10 AM  Clinical Narrative:    CSW provided bed offers to pt and dtr at bedside. [facility] has offered and pt has accepted the bed. CSW notified facility. CSW left VM with [facility] to start auth for SNF and transport. TOC will continue to follow for DC needs.   11:10 Auth pending for [facility] and transport.  2:40 Authorization approved for SNF [id number] and ambulance [phone]. Pt can got to [facility] on Friday. Medical team, pt and family notified. TOC will continue to follow.  Expected Discharge Plan: Skilled Nursing Facility Barriers to Discharge: Continued Medical Work up  Expected Discharge Plan and Services   Discharge Planning Services: CM Consult Post Acute Care Choice: Skilled Nursing Facility Living arrangements for the past 2 months: Single Family Home                 DME Arranged: N/A DME Agency: NA       HH Arranged: NA           Social Determinants of Health (SDOH) Interventions SDOH Screenings   Food Insecurity: No Food Insecurity (10/19/2023)  Housing: Low Risk  (10/19/2023)  Transportation Needs: No Transportation Needs (10/19/2023)  Utilities: Not At Risk (10/19/2023)  Tobacco Use: Low Risk  (10/19/2023)    Readmission Risk Interventions     No data to display

## 2023-10-20 NOTE — Progress Notes (Signed)
   10/20/23 1400  Mobility  Activity Ambulated with assistance in hallway;Transferred to/from [facility]  Level of Assistance Minimal assist, patient does 75% or more  Assistive Device Front wheel walker  Distance Ambulated (ft) 50 ft  Activity Response Tolerated fair  Mobility Referral Yes  $Mobility charge 1 Mobility  Mobility Specialist Start Time (ACUTE ONLY) 1327  Mobility Specialist Stop Time (ACUTE ONLY) 1400  Mobility Specialist Time Calculation (min) (ACUTE ONLY) 33 min   Mobility Specialist: Progress Note  Pt agreeable to mobility session - received in [facility]. Required MinA using RW. C/o R. Knee pain and not having a BM. Returned to chair with all needs met - call bell within reach. Chair alarm on.   Barnie Mort, BS Mobility Specialist Please contact via SecureChat or Rehab office at [phone].

## 2023-10-21 DIAGNOSIS — I10 Essential (primary) hypertension: Secondary | ICD-10-CM | POA: Diagnosis not present

## 2023-10-21 DIAGNOSIS — W1830XA Fall on same level, unspecified, initial encounter: Secondary | ICD-10-CM | POA: Diagnosis not present

## 2023-10-21 DIAGNOSIS — M7041 Prepatellar bursitis, right knee: Secondary | ICD-10-CM | POA: Diagnosis not present

## 2023-10-21 LAB — MAGNESIUM: Magnesium: 1.9 mg/dL (ref 1.7–2.4)

## 2023-10-21 LAB — CBC
HCT: 35.3 % — ABNORMAL LOW (ref 36.0–46.0)
Hemoglobin: 11 g/dL — ABNORMAL LOW (ref 12.0–15.0)
MCH: 28.5 pg (ref 26.0–34.0)
MCHC: 31.2 g/dL (ref 30.0–36.0)
MCV: 91.5 fL (ref 80.0–100.0)
Platelets: 241 10*3/uL (ref 150–400)
RBC: 3.86 MIL/uL — ABNORMAL LOW (ref 3.87–5.11)
RDW: 13.2 % (ref 11.5–15.5)
WBC: 11.8 10*3/uL — ABNORMAL HIGH (ref 4.0–10.5)
nRBC: 0 % (ref 0.0–0.2)

## 2023-10-21 LAB — GLUCOSE, CAPILLARY
Glucose-Capillary: 164 mg/dL — ABNORMAL HIGH (ref 70–99)
Glucose-Capillary: 227 mg/dL — ABNORMAL HIGH (ref 70–99)
Glucose-Capillary: 188 mg/dL — ABNORMAL HIGH (ref 70–99)
Glucose-Capillary: 245 mg/dL — ABNORMAL HIGH (ref 70–99)

## 2023-10-21 LAB — BASIC METABOLIC PANEL
Anion gap: 9 (ref 5–15)
BUN: 20 mg/dL (ref 8–23)
CO2: 23 mmol/L (ref 22–32)
Calcium: 8.6 mg/dL — ABNORMAL LOW (ref 8.9–10.3)
Chloride: 104 mmol/L (ref 98–111)
Creatinine, Ser: 0.69 mg/dL (ref 0.44–1.00)
GFR, Estimated: >60 mL/min (ref >60)
Glucose, Bld: 185 mg/dL — ABNORMAL HIGH (ref 70–99)
Potassium: 4.2 mmol/L (ref 3.5–5.1)
Sodium: 136 mmol/L (ref 135–145)

## 2023-10-21 MED ORDER — LOSARTAN POTASSIUM 50 MG PO TABS
50.0000 mg | ORAL_TABLET | Freq: Every day | ORAL | Status: DC
  Administered 2023-10-21 – 2023-10-22 (×2): 50 mg via ORAL
  Filled 2023-10-21 (×2): qty 1

## 2023-10-21 NOTE — TOC Progression Note (Signed)
Transition of Care [facility]) - Progression Note    Patient Details  Name: Erika Hamilton MRN: [id number] Date of Birth: 12/27/1932  [facility]) CM/SW Contact  Delilah Shan, LCSWA Phone Number: 10/21/2023, 12:13 PM  Clinical Narrative:     Patient can dc over to [facility] on Friday if medically ready. Insurance authorization approved for SNF and PTAR. TOC will continue to follow.  Expected Discharge Plan: Skilled Nursing Facility Barriers to Discharge: Continued Medical Work up  Expected Discharge Plan and Services   Discharge Planning Services: CM Consult Post Acute Care Choice: Skilled Nursing Facility Living arrangements for the past 2 months: Single Family Home                 DME Arranged: N/A DME Agency: NA       HH Arranged: NA           Social Determinants of Health (SDOH) Interventions SDOH Screenings   Food Insecurity: No Food Insecurity (10/19/2023)  Housing: Low Risk  (10/19/2023)  Transportation Needs: No Transportation Needs (10/19/2023)  Utilities: Not At Risk (10/19/2023)  Tobacco Use: Low Risk  (10/19/2023)    Readmission Risk Interventions     No data to display

## 2023-10-21 NOTE — Progress Notes (Signed)
Mobility Specialist Progress Note:   10/21/23 1300  Mobility  Activity Ambulated with assistance in room  Level of Assistance Minimal assist, patient does 75% or more  Assistive Device Front wheel walker  Distance Ambulated (ft) 60 ft  Activity Response Tolerated well  Mobility Referral Yes  $Mobility charge 1 Mobility  Mobility Specialist Start Time (ACUTE ONLY) 1250  Mobility Specialist Stop Time (ACUTE ONLY) 1305  Mobility Specialist Time Calculation (min) (ACUTE ONLY) 15 min   Pt agreeable to mobility session. Required only light minA to stand, minG to ambulate. Pt c/o R knee pain, otherwise no complaints. Pt back in chair with all needs met.   Addison Lank Mobility Specialist Please contact via SecureChat or  Rehab office at [phone]

## 2023-10-21 NOTE — Progress Notes (Signed)
  Progress Note   Patient: Erika Hamilton KZS:[id number] DOB: 01-11-1933 DOA: 10/19/2023     0 DOS: the patient was seen and examined on 10/21/2023   Brief hospital course: No notes on file  Assessment and Plan:  89 year old female with history of diabetes mellitus, hypertension, hyperlipidemia, CAD, obesity, breast cancer, squamous cell carcinoma of the tongue is brought to the emergency department by EMS after a fall at home while getting out of the bathroom, tripped and fell.  Denied any loss of consciousness.  Patient fell forward landing on her right knee and hit her head, followed by nosebleed.  CT head without contrast did not show any acute intracranial abnormality.  CT C-spine, maxillofacial, x-ray of the hip negative for any acute injuries.  CT of the right knee showed large anterior patellar hematoma related to bursal rupture, bursitis.  Lab workup is unremarkable  Right knee prepatellar hematoma -Continue with pain as needed -Apply ice, rest  -Appreciate orthopedic surgery evaluation and recommendation -Evaluated by physical therapy-recommend SNF -Consult case management for assistance for placement to SNF-pending authorization  Hypertension accelerated -Secondary to pain -Continue with pain management as needed -Continue with amlodipine -Continues to have improvement -Add losartan.  Urinary tract infection -Follow-up with urine cultures -Continue with Rocephin  Diabetes mellitus -Get hemoglobin A1c -Follow-up on sliding scale insulin  Subjective: Continues to have improvement with the pain in the right knee.  Sitting in the chair, eating breakfast  Physical Exam: Vitals:   10/20/23 1557 10/20/23 2048 10/21/23 0530 10/21/23 0741  BP: (!) 145/45 (!) 150/55 (!) 157/53 (!) 170/57  Pulse: 76 81 84 84  Resp: 16 18 18 16   Temp: (!) 97.5 F (36.4 C) 97.7 F (36.5 C) 97.8 F (36.6 C) 97.9 F (36.6 C)  TempSrc: Oral Oral Oral Oral  SpO2: 99% 100% 98% 100%   Weight:      Height:       Gen: Awake, alert, NAD CV: Regular, normal S1, S2, no murmurs  Resp: Normal WOB, CTAB  Abd: Flat, normoactive, nontender MSK: Right knee has an Ace wrap in place, large effusion over the right knee, tenderness along the joint line as well, limited active and passive range of motion secondary to pain.  Distally neurovascularly intact.  Continues to have improvement with the tenderness. Skin: Ecchymosis over the left eyebrow, see MSK for additional findings Neuro: Alert and interactive  Psych: euthymic, appropriate   Family Communication:  patient's son, patient  Disposition: Status is: Observation The patient will require care spanning > 2 midnights and should be moved to inpatient because:  severe right  knee pain  Planned Discharge Destination: Discharge to skilled nursing facility 11/29  Time spent:  40 minutes  Author: Susa Griffins, MD 10/21/2023 8:22 AM  For on call review [email or web address].

## 2023-10-22 DIAGNOSIS — M25561 Pain in right knee: Secondary | ICD-10-CM | POA: Diagnosis not present

## 2023-10-22 DIAGNOSIS — M7041 Prepatellar bursitis, right knee: Secondary | ICD-10-CM | POA: Diagnosis not present

## 2023-10-22 DIAGNOSIS — E119 Type 2 diabetes mellitus without complications: Secondary | ICD-10-CM | POA: Diagnosis not present

## 2023-10-22 DIAGNOSIS — F6 Specific personality disorders: Secondary | ICD-10-CM | POA: Diagnosis not present

## 2023-10-22 DIAGNOSIS — R011 Cardiac murmur, unspecified: Secondary | ICD-10-CM | POA: Diagnosis not present

## 2023-10-22 DIAGNOSIS — I10 Essential (primary) hypertension: Secondary | ICD-10-CM | POA: Diagnosis not present

## 2023-10-22 DIAGNOSIS — R12 Heartburn: Secondary | ICD-10-CM | POA: Diagnosis not present

## 2023-10-22 DIAGNOSIS — A499 Bacterial infection, unspecified: Secondary | ICD-10-CM | POA: Diagnosis not present

## 2023-10-22 DIAGNOSIS — K5900 Constipation, unspecified: Secondary | ICD-10-CM | POA: Diagnosis not present

## 2023-10-22 DIAGNOSIS — I2510 Atherosclerotic heart disease of native coronary artery without angina pectoris: Secondary | ICD-10-CM | POA: Diagnosis not present

## 2023-10-22 DIAGNOSIS — E1151 Type 2 diabetes mellitus with diabetic peripheral angiopathy without gangrene: Secondary | ICD-10-CM | POA: Diagnosis not present

## 2023-10-22 DIAGNOSIS — E669 Obesity, unspecified: Secondary | ICD-10-CM | POA: Diagnosis not present

## 2023-10-22 DIAGNOSIS — Z7401 Bed confinement status: Secondary | ICD-10-CM | POA: Diagnosis not present

## 2023-10-22 DIAGNOSIS — W1830XA Fall on same level, unspecified, initial encounter: Secondary | ICD-10-CM | POA: Diagnosis not present

## 2023-10-22 DIAGNOSIS — K760 Fatty (change of) liver, not elsewhere classified: Secondary | ICD-10-CM | POA: Diagnosis not present

## 2023-10-22 DIAGNOSIS — W19XXXA Unspecified fall, initial encounter: Secondary | ICD-10-CM | POA: Diagnosis not present

## 2023-10-22 DIAGNOSIS — M25461 Effusion, right knee: Secondary | ICD-10-CM | POA: Diagnosis not present

## 2023-10-22 DIAGNOSIS — Z85810 Personal history of malignant neoplasm of tongue: Secondary | ICD-10-CM | POA: Diagnosis not present

## 2023-10-22 DIAGNOSIS — E0865 Diabetes mellitus due to underlying condition with hyperglycemia: Secondary | ICD-10-CM | POA: Diagnosis not present

## 2023-10-22 DIAGNOSIS — Z853 Personal history of malignant neoplasm of breast: Secondary | ICD-10-CM | POA: Diagnosis not present

## 2023-10-22 DIAGNOSIS — S8001XA Contusion of right knee, initial encounter: Secondary | ICD-10-CM | POA: Diagnosis not present

## 2023-10-22 DIAGNOSIS — K219 Gastro-esophageal reflux disease without esophagitis: Secondary | ICD-10-CM | POA: Diagnosis not present

## 2023-10-22 DIAGNOSIS — W1830XD Fall on same level, unspecified, subsequent encounter: Secondary | ICD-10-CM | POA: Diagnosis not present

## 2023-10-22 DIAGNOSIS — S8001XD Contusion of right knee, subsequent encounter: Secondary | ICD-10-CM | POA: Diagnosis not present

## 2023-10-22 DIAGNOSIS — N390 Urinary tract infection, site not specified: Secondary | ICD-10-CM | POA: Diagnosis not present

## 2023-10-22 DIAGNOSIS — R079 Chest pain, unspecified: Secondary | ICD-10-CM | POA: Diagnosis not present

## 2023-10-22 DIAGNOSIS — M7051 Other bursitis of knee, right knee: Secondary | ICD-10-CM | POA: Diagnosis not present

## 2023-10-22 DIAGNOSIS — E1165 Type 2 diabetes mellitus with hyperglycemia: Secondary | ICD-10-CM | POA: Diagnosis not present

## 2023-10-22 DIAGNOSIS — E785 Hyperlipidemia, unspecified: Secondary | ICD-10-CM | POA: Diagnosis not present

## 2023-10-22 DIAGNOSIS — R531 Weakness: Secondary | ICD-10-CM | POA: Diagnosis not present

## 2023-10-22 LAB — GLUCOSE, CAPILLARY
Glucose-Capillary: 163 mg/dL — ABNORMAL HIGH (ref 70–99)
Glucose-Capillary: 248 mg/dL — ABNORMAL HIGH (ref 70–99)

## 2023-10-22 MED ORDER — LOSARTAN POTASSIUM 50 MG PO TABS: 50.0000 mg | ORAL_TABLET | Freq: Every day | ORAL | 0 refills | Status: AC

## 2023-10-22 MED ORDER — OXYCODONE HCL 5 MG PO TABS: 5.0000 mg | ORAL_TABLET | Freq: Four times a day (QID) | ORAL | 0 refills | Status: AC | PRN

## 2023-10-22 MED ORDER — POLYETHYLENE GLYCOL 3350 17 G PO PACK: 17.0000 g | PACK | Freq: Every day | ORAL | 0 refills | Status: AC | PRN

## 2023-10-22 NOTE — Plan of Care (Signed)
  Problem: Education: Goal: Ability to describe self-care measures that may prevent or decrease complications (Diabetes Survival Skills Education) will improve Outcome: Progressing   Problem: Coping: Goal: Ability to adjust to condition or change in health will improve Outcome: Progressing   Problem: Nutritional: Goal: Maintenance of adequate nutrition will improve Outcome: Progressing   Problem: Skin Integrity: Goal: Risk for impaired skin integrity will decrease Outcome: Progressing   Problem: Education: Goal: Knowledge of General Education information will improve Description: Including pain rating scale, medication(s)/side effects and non-pharmacologic comfort measures Outcome: Progressing   Problem: Coping: Goal: Level of anxiety will decrease Outcome: Progressing

## 2023-10-22 NOTE — Progress Notes (Signed)
Mobility Specialist Progress Note:   10/22/23 1130  Mobility  Activity Ambulated with assistance in room  Level of Assistance Minimal assist, patient does 75% or more  Assistive Device Front wheel walker  Distance Ambulated (ft) 65 ft  Activity Response Tolerated well  Mobility Referral Yes  $Mobility charge 1 Mobility  Mobility Specialist Start Time (ACUTE ONLY) 1130  Mobility Specialist Stop Time (ACUTE ONLY) 1150  Mobility Specialist Time Calculation (min) (ACUTE ONLY) 20 min   Pt agreeable to mobility session. Required minA to stand from chair, minG to ambulate. C/o R knee soreness, otherwise asx. Pt back in bed with all needs met, alarm on.   Addison Lank Mobility Specialist Please contact via SecureChat or  Rehab office at [phone]

## 2023-10-22 NOTE — TOC Transition Note (Signed)
Transition of Care [facility]) - CM/SW Discharge Note   Patient Details  Name: Erika Hamilton MRN: [id number] Date of Birth: 09/10/1933  [facility] CM/SW Contact:  Carley Hammed, LCSW Phone Number: 10/22/2023, 9:51 AM   Clinical Narrative:    Pt to be transported to [facility] via [facility]. Nurse to call report to [phone].   Final next level of care: Skilled Nursing Facility Barriers to Discharge: Barriers Resolved   Patient Goals and CMS Choice      Discharge Placement                Patient chooses bed at: [facility] [place] Patient to be transferred to facility by: [facility] Name of family member notified: Son, John Patient and family notified of of transfer: 10/22/23  Discharge Plan and Services Additional resources added to the After Visit Summary for     Discharge Planning Services: CM Consult Post Acute Care Choice: Skilled Nursing Facility          DME Arranged: N/A DME Agency: NA       HH Arranged: NA          Social Determinants of Health (SDOH) Interventions SDOH Screenings   Food Insecurity: No Food Insecurity (10/19/2023)  Housing: Low Risk  (10/19/2023)  Transportation Needs: No Transportation Needs (10/19/2023)  Utilities: Not At Risk (10/19/2023)  Tobacco Use: Low Risk  (10/19/2023)     Readmission Risk Interventions     No data to display

## 2023-10-22 NOTE — Discharge Summary (Signed)
Physician Discharge Summary   Patient: Erika Hamilton MRN: [id number] DOB: 11/25/1932  Admit date:     10/19/2023  Discharge date: 10/22/23  Discharge Physician: [id number]   PCP: Philemon Kingdom, MD   Recommendations at discharge:   Follow-up with the primary care physician in 1 week Follow-up with the Dr. Charlann Boxer in 2 weeks  Discharge Diagnoses: Principal Problem:   Ground-level fall Right knee prepatellar hematoma Hypertension accelerated Urinary tract infection Diabetes mellitus Resolved Problems:   * No resolved hospital problems. *  Hospital Course:  89 year old female with history of diabetes mellitus, hypertension, hyperlipidemia, CAD, obesity, breast cancer, squamous cell carcinoma of the tongue is brought to the emergency department by EMS after a fall at home while getting out of the bathroom, tripped and fell.  Denied any loss of consciousness.  Patient fell forward landing on her right knee and hit her head, followed by nosebleed.  CT head without contrast did not show any acute intracranial abnormality.  CT C-spine, maxillofacial, x-ray of the hip negative for any acute injuries.  CT of the right knee showed large anterior patellar hematoma related to bursal rupture, bursitis.  Lab workup is unremarkable.  Patient was continued with ice, rest.  Orthopedic surgery is consulted, recommended to continue the current management.  Evaluated by physical therapy, recommended skilled nursing facility placement as patient lives by herself, continues to have significant pain in the right knee.  Patient is recommended to follow-up with primary care physician in 1 week   Right knee prepatellar hematoma -Continue with pain as needed -Apply ice, rest  -Appreciate orthopedic surgery evaluation and recommendation -Evaluated by physical therapy-recommend SNF -Consult case management for assistance for placement to SNF   Hypertension accelerated -Secondary to  pain -Continue with pain management as needed -Continue with Coreg, losartan  Urinary tract infection -Follow-up with urine cultures-negative -Continue with Rocephin.   Diabetes mellitus -Get hemoglobin A1c -Follow-up on sliding scale insulin   Consultants: Orthopedic surgery Dr. Charlann Boxer Procedures performed: None Disposition: Skilled nursing facility Diet recommendation:  Discharge Diet Orders (From admission, onward)     Start     Ordered   10/22/23 0000  Diet - low sodium heart healthy        10/22/23 0845   10/22/23 0000  Diet Carb Modified        10/22/23 0845           Carb modified diet DISCHARGE MEDICATION: Allergies as of 10/22/2023       Reactions   Atorvastatin    myalgia   Metformin Hcl    GI upset        Medication List     STOP taking these medications    ferrous sulfate 325 (65 FE) MG EC tablet   PREBIOTIC PRODUCT PO   PROBIOTIC DAILY PO       TAKE these medications    aspirin EC 81 MG tablet Take 81 mg by mouth daily.   atorvastatin 40 MG tablet Commonly known as: LIPITOR Take 40 mg by mouth daily.   benazepril 40 MG tablet Commonly known as: LOTENSIN Take 40 mg by mouth daily.   calcium-vitamin D 500-200 MG-UNIT tablet Commonly known as: OSCAL WITH D Take 2 tablets by mouth daily with breakfast.   carvedilol 25 MG tablet Commonly known as: COREG Take 25 mg by mouth 2 (two) times daily.   losartan 50 MG tablet Commonly known as: COZAAR Take 1 tablet (50 mg total) by mouth daily.  nitroGLYCERIN 0.4 MG SL tablet Commonly known as: NITROSTAT Place 1 tablet (0.4 mg total) under the tongue every 5 (five) minutes as needed for chest pain. Patient needs appointment for further refills. 1 st attempt   oxyCODONE 5 MG immediate release tablet Commonly known as: Oxy IR/ROXICODONE Take 1 tablet (5 mg total) by mouth every 6 (six) hours as needed for up to 3 days for severe pain (pain score 7-10).   pantoprazole 20 MG  tablet Commonly known as: PROTONIX Take 20 mg by mouth daily as needed for heartburn.   polyethylene glycol 17 g packet Commonly known as: MIRALAX / GLYCOLAX Take 17 g by mouth daily as needed for mild constipation.   Evaristo Bury FlexTouch 200 UNIT/ML FlexTouch Pen Generic drug: insulin degludec Inject 48 Units into the skin daily.        Contact information for follow-up providers     Durene Romans, MD Follow up.   Specialty: Orthopedic Surgery Why: As needed for follow up evaluation of her right knee Contact information: [address] [place] [address] [phone]              Contact information for after-discharge care     Destination     [facility] Preferred SNF .   Service: Skilled Nursing Contact information: [address] [place] [address] [phone]                    Discharge Exam: Ceasar Mons Weights   10/20/23 0628  Weight: 77.1 kg   Gen: Awake, alert, NAD CV: Regular, normal S1, S2, no murmurs  Resp: Normal WOB, CTAB  Abd: Flat, normoactive, nontender MSK: Right knee has an Ace wrap in place, large effusion over the right knee, tenderness along the joint line as well, limited active and passive range of motion secondary to pain.  Distally neurovascularly intact. Skin: Ecchymosis over the left eyebrow, see MSK for additional findings Neuro: Alert and interactive  Psych: euthymic, appropriate   Condition at discharge: improving  The results of significant diagnostics from this hospitalization (including imaging, microbiology, ancillary and laboratory) are listed below for reference.   Imaging Studies: DG CHEST PORT 1 VIEW  Result Date: 10/19/2023 CLINICAL DATA:  Leukocytosis EXAM: PORTABLE CHEST 1 VIEW COMPARISON:  07/27/2014 FINDINGS: A small infiltrate is possible in the left suprahilar lung. No edema, effusion, or pneumothorax. Normal heart size and mediastinal  contours. No visible effusion or pneumothorax. Postoperative left axilla. IMPRESSION: Possible small left suprahilar pneumonia. Electronically Signed   By: Tiburcio Pea M.D.   On: 10/19/2023 04:53   CT Knee Right Wo Contrast  Result Date: 10/19/2023 CLINICAL DATA:  Knee trauma, x-ray done, occult fracture suspected EXAM: CT OF THE RIGHT KNEE WITHOUT CONTRAST TECHNIQUE: Multidetector CT imaging of the right knee was performed according to the standard protocol. Multiplanar CT image reconstructions were also generated. RADIATION DOSE REDUCTION: This exam was performed according to the departmental dose-optimization program which includes automated exposure control, adjustment of the mA and/or kV according to patient size and/or use of iterative reconstruction technique. COMPARISON:  Same day knee radiographs FINDINGS: Bones/Joint/Cartilage No acute fracture or dislocation.  Trace knee joint effusion. Ligaments Suboptimally assessed by CT. Muscles and Tendons Intact. Soft tissues Large intermediate to high density fluid collection anterior to the patella measuring 8.9 x 3.5 x 10.9 cm. Adjacent soft tissue edema in the anterior thigh and calf. IMPRESSION: 1. Large intermediate to high density fluid collection anterior to  the patella compatible with hematoma. This may be related to bursitis/bursal rupture. 2. No acute fracture. Electronically Signed   By: Minerva Fester M.D.   On: 10/19/2023 02:33   DG Hip Unilat W or Wo Pelvis 2-3 Views Right  Result Date: 10/19/2023 CLINICAL DATA:  Fall landed on right knee with knee pain. EXAM: DG HIP (WITH OR WITHOUT PELVIS) 2-3V RIGHT COMPARISON:  None Available. FINDINGS: No acute fracture or dislocation. Degenerative changes pubic symphysis, both hips, SI joints and lower lumbar spine. IMPRESSION: No acute fracture or dislocation. Electronically Signed   By: Minerva Fester M.D.   On: 10/19/2023 02:09   DG Knee Complete 4 Views Right  Result Date:  10/19/2023 CLINICAL DATA:  Right knee pain and swelling status post fall landing on right knee EXAM: RIGHT KNEE - COMPLETE 4+ VIEW COMPARISON:  None Available. FINDINGS: No acute fracture or dislocation. Small knee joint effusion. Focal soft tissue swelling anterior to the patella. IMPRESSION: 1. No acute fracture or dislocation. 2. Focal soft tissue swelling anterior to the patella may be due to a hematoma or prepatellar bursitis. Electronically Signed   By: Minerva Fester M.D.   On: 10/19/2023 02:09   CT Head Wo Contrast  Result Date: 10/19/2023 CLINICAL DATA:  89 year old female lost of balance and fell this evening. EXAM: CT HEAD WITHOUT CONTRAST CT MAXILLOFACIAL WITHOUT CONTRAST CT CERVICAL SPINE WITHOUT CONTRAST TECHNIQUE: Multidetector CT imaging of the head, cervical spine, and maxillofacial structures were performed using the standard protocol without intravenous contrast. Multiplanar CT image reconstructions of the cervical spine and maxillofacial structures were also generated. RADIATION DOSE REDUCTION: This exam was performed according to the departmental dose-optimization program which includes automated exposure control, adjustment of the mA and/or kV according to patient size and/or use of iterative reconstruction technique. COMPARISON:  None Available. FINDINGS: CT HEAD FINDINGS Brain: No intracranial hemorrhage, mass effect, or evidence of acute infarct. No hydrocephalus. No extra-axial fluid collection. Age related cerebral atrophy and chronic small vessel ischemic disease. Vascular: No hyperdense vessel. Intracranial arterial calcification. Skull: No fracture or focal lesion. Other: None. CT MAXILLOFACIAL FINDINGS Osseous: No fracture or mandibular dislocation. No destructive process. Orbits: Negative. No traumatic or inflammatory finding. Sinuses: Clear. Soft tissues: Negative. CT CERVICAL SPINE FINDINGS Alignment: No evidence of traumatic malalignment. Skull base and vertebrae: No acute  fracture. Soft tissues and spinal canal: No prevertebral fluid or swelling. No visible canal hematoma. Disc levels: Mild multilevel spondylosis and facet arthropathy. No severe spinal canal narrowing. Upper chest: No acute abnormality. Other: Carotid calcification. IMPRESSION: 1. No acute intracranial abnormality. 2. No acute facial bone fracture. 3. No acute fracture in the cervical spine. Electronically Signed   By: Minerva Fester M.D.   On: 10/19/2023 01:21   CT Cervical Spine Wo Contrast  Result Date: 10/19/2023 CLINICAL DATA:  89 year old female lost of balance and fell this evening. EXAM: CT HEAD WITHOUT CONTRAST CT MAXILLOFACIAL WITHOUT CONTRAST CT CERVICAL SPINE WITHOUT CONTRAST TECHNIQUE: Multidetector CT imaging of the head, cervical spine, and maxillofacial structures were performed using the standard protocol without intravenous contrast. Multiplanar CT image reconstructions of the cervical spine and maxillofacial structures were also generated. RADIATION DOSE REDUCTION: This exam was performed according to the departmental dose-optimization program which includes automated exposure control, adjustment of the mA and/or kV according to patient size and/or use of iterative reconstruction technique. COMPARISON:  None Available. FINDINGS: CT HEAD FINDINGS Brain: No intracranial hemorrhage, mass effect, or evidence of acute infarct. No hydrocephalus. No extra-axial fluid  collection. Age related cerebral atrophy and chronic small vessel ischemic disease. Vascular: No hyperdense vessel. Intracranial arterial calcification. Skull: No fracture or focal lesion. Other: None. CT MAXILLOFACIAL FINDINGS Osseous: No fracture or mandibular dislocation. No destructive process. Orbits: Negative. No traumatic or inflammatory finding. Sinuses: Clear. Soft tissues: Negative. CT CERVICAL SPINE FINDINGS Alignment: No evidence of traumatic malalignment. Skull base and vertebrae: No acute fracture. Soft tissues and spinal  canal: No prevertebral fluid or swelling. No visible canal hematoma. Disc levels: Mild multilevel spondylosis and facet arthropathy. No severe spinal canal narrowing. Upper chest: No acute abnormality. Other: Carotid calcification. IMPRESSION: 1. No acute intracranial abnormality. 2. No acute facial bone fracture. 3. No acute fracture in the cervical spine. Electronically Signed   By: Minerva Fester M.D.   On: 10/19/2023 01:21   CT Maxillofacial Wo Contrast  Result Date: 10/19/2023 CLINICAL DATA:  89 year old female lost of balance and fell this evening. EXAM: CT HEAD WITHOUT CONTRAST CT MAXILLOFACIAL WITHOUT CONTRAST CT CERVICAL SPINE WITHOUT CONTRAST TECHNIQUE: Multidetector CT imaging of the head, cervical spine, and maxillofacial structures were performed using the standard protocol without intravenous contrast. Multiplanar CT image reconstructions of the cervical spine and maxillofacial structures were also generated. RADIATION DOSE REDUCTION: This exam was performed according to the departmental dose-optimization program which includes automated exposure control, adjustment of the mA and/or kV according to patient size and/or use of iterative reconstruction technique. COMPARISON:  None Available. FINDINGS: CT HEAD FINDINGS Brain: No intracranial hemorrhage, mass effect, or evidence of acute infarct. No hydrocephalus. No extra-axial fluid collection. Age related cerebral atrophy and chronic small vessel ischemic disease. Vascular: No hyperdense vessel. Intracranial arterial calcification. Skull: No fracture or focal lesion. Other: None. CT MAXILLOFACIAL FINDINGS Osseous: No fracture or mandibular dislocation. No destructive process. Orbits: Negative. No traumatic or inflammatory finding. Sinuses: Clear. Soft tissues: Negative. CT CERVICAL SPINE FINDINGS Alignment: No evidence of traumatic malalignment. Skull base and vertebrae: No acute fracture. Soft tissues and spinal canal: No prevertebral fluid or  swelling. No visible canal hematoma. Disc levels: Mild multilevel spondylosis and facet arthropathy. No severe spinal canal narrowing. Upper chest: No acute abnormality. Other: Carotid calcification. IMPRESSION: 1. No acute intracranial abnormality. 2. No acute facial bone fracture. 3. No acute fracture in the cervical spine. Electronically Signed   By: Minerva Fester M.D.   On: 10/19/2023 01:21    Microbiology: No results found for this or any previous visit.  Labs: CBC: Recent Labs  Lab 10/19/23 0036 10/21/23 0900  WBC 17.6* 11.8*  NEUTROABS 13.3*  --   HGB 12.4 11.0*  HCT 39.6 35.3*  MCV 92.1 91.5  PLT 285 241   Basic Metabolic Panel: Recent Labs  Lab 10/19/23 0036 10/21/23 0900  NA 138 136  K 4.5 4.2  CL 106 104  CO2 25 23  GLUCOSE 195* 185*  BUN 25* 20  CREATININE 1.04* 0.69  CALCIUM 9.1 8.6*  MG  --  1.9   Liver Function Tests: Recent Labs  Lab 10/19/23 0036  AST 16  ALT 12  ALKPHOS 61  BILITOT 0.6  PROT 6.8  ALBUMIN 3.2*   CBG: Recent Labs  Lab 10/21/23 0804 10/21/23 1149 10/21/23 1646 10/21/23 2113 10/22/23 0809  GLUCAP 164* 227* 188* 245* 163*    Discharge time spent: greater than 30 minutes.  SignedSusa Griffins, MD [facility] 10/22/2023

## 2023-10-24 DIAGNOSIS — S8001XA Contusion of right knee, initial encounter: Secondary | ICD-10-CM | POA: Diagnosis not present

## 2023-10-24 DIAGNOSIS — W19XXXA Unspecified fall, initial encounter: Secondary | ICD-10-CM | POA: Diagnosis not present

## 2023-10-24 DIAGNOSIS — E1151 Type 2 diabetes mellitus with diabetic peripheral angiopathy without gangrene: Secondary | ICD-10-CM | POA: Diagnosis not present

## 2023-10-24 DIAGNOSIS — E0865 Diabetes mellitus due to underlying condition with hyperglycemia: Secondary | ICD-10-CM | POA: Diagnosis not present

## 2023-10-24 DIAGNOSIS — M7041 Prepatellar bursitis, right knee: Secondary | ICD-10-CM | POA: Diagnosis not present

## 2023-10-24 DIAGNOSIS — K219 Gastro-esophageal reflux disease without esophagitis: Secondary | ICD-10-CM | POA: Diagnosis not present

## 2023-10-24 DIAGNOSIS — E1165 Type 2 diabetes mellitus with hyperglycemia: Secondary | ICD-10-CM | POA: Diagnosis not present

## 2023-10-24 DIAGNOSIS — E785 Hyperlipidemia, unspecified: Secondary | ICD-10-CM | POA: Diagnosis not present

## 2023-10-24 DIAGNOSIS — I10 Essential (primary) hypertension: Secondary | ICD-10-CM | POA: Diagnosis not present

## 2023-11-03 DIAGNOSIS — M25561 Pain in right knee: Secondary | ICD-10-CM | POA: Diagnosis not present

## 2023-11-15 ENCOUNTER — Other Ambulatory Visit: Payer: Self-pay | Admitting: Pediatric Surgery

## 2023-11-15 DIAGNOSIS — M25469 Effusion, unspecified knee: Secondary | ICD-10-CM

## 2023-11-15 DIAGNOSIS — M25561 Pain in right knee: Secondary | ICD-10-CM

## 2023-11-16 DIAGNOSIS — M25461 Effusion, right knee: Secondary | ICD-10-CM | POA: Diagnosis not present

## 2023-11-16 DIAGNOSIS — M7041 Prepatellar bursitis, right knee: Secondary | ICD-10-CM | POA: Diagnosis not present

## 2023-11-30 DIAGNOSIS — A499 Bacterial infection, unspecified: Secondary | ICD-10-CM | POA: Diagnosis not present

## 2023-11-30 DIAGNOSIS — N390 Urinary tract infection, site not specified: Secondary | ICD-10-CM | POA: Diagnosis not present

## 2023-12-02 ENCOUNTER — Ambulatory Visit
Admission: RE | Admit: 2023-12-02 | Discharge: 2023-12-02 | Disposition: A | Discharge status: Home / Self Care | Payer: PPO | Source: Ambulatory Visit | Attending: Pediatric Surgery | Admitting: Pediatric Surgery

## 2023-12-02 DIAGNOSIS — M25561 Pain in right knee: Secondary | ICD-10-CM

## 2023-12-02 DIAGNOSIS — S8001XA Contusion of right knee, initial encounter: Secondary | ICD-10-CM | POA: Diagnosis not present

## 2023-12-02 DIAGNOSIS — M25469 Effusion, unspecified knee: Secondary | ICD-10-CM

## 2023-12-08 DIAGNOSIS — S8001XD Contusion of right knee, subsequent encounter: Secondary | ICD-10-CM | POA: Diagnosis not present

## 2023-12-29 DIAGNOSIS — Z7982 Long term (current) use of aspirin: Secondary | ICD-10-CM | POA: Diagnosis not present

## 2023-12-29 DIAGNOSIS — E669 Obesity, unspecified: Secondary | ICD-10-CM | POA: Diagnosis not present

## 2023-12-29 DIAGNOSIS — S8001XA Contusion of right knee, initial encounter: Secondary | ICD-10-CM | POA: Diagnosis not present

## 2023-12-29 DIAGNOSIS — Z6833 Body mass index (BMI) 33.0-33.9, adult: Secondary | ICD-10-CM | POA: Diagnosis not present

## 2023-12-29 DIAGNOSIS — Z9181 History of falling: Secondary | ICD-10-CM | POA: Diagnosis not present

## 2023-12-29 DIAGNOSIS — E1159 Type 2 diabetes mellitus with other circulatory complications: Secondary | ICD-10-CM | POA: Diagnosis not present

## 2023-12-29 DIAGNOSIS — K219 Gastro-esophageal reflux disease without esophagitis: Secondary | ICD-10-CM | POA: Diagnosis not present

## 2023-12-29 DIAGNOSIS — Z853 Personal history of malignant neoplasm of breast: Secondary | ICD-10-CM | POA: Diagnosis not present

## 2023-12-29 DIAGNOSIS — I2510 Atherosclerotic heart disease of native coronary artery without angina pectoris: Secondary | ICD-10-CM | POA: Diagnosis not present

## 2023-12-29 DIAGNOSIS — S83206D Unspecified tear of unspecified meniscus, current injury, right knee, subsequent encounter: Secondary | ICD-10-CM | POA: Diagnosis not present

## 2023-12-29 DIAGNOSIS — M7989 Other specified soft tissue disorders: Secondary | ICD-10-CM | POA: Diagnosis not present

## 2023-12-29 DIAGNOSIS — I509 Heart failure, unspecified: Secondary | ICD-10-CM | POA: Diagnosis not present

## 2023-12-29 DIAGNOSIS — M7051 Other bursitis of knee, right knee: Secondary | ICD-10-CM | POA: Diagnosis not present

## 2023-12-29 DIAGNOSIS — E119 Type 2 diabetes mellitus without complications: Secondary | ICD-10-CM | POA: Diagnosis not present

## 2023-12-29 DIAGNOSIS — Z85810 Personal history of malignant neoplasm of tongue: Secondary | ICD-10-CM | POA: Diagnosis not present

## 2023-12-29 DIAGNOSIS — K760 Fatty (change of) liver, not elsewhere classified: Secondary | ICD-10-CM | POA: Diagnosis not present

## 2023-12-29 DIAGNOSIS — R52 Pain, unspecified: Secondary | ICD-10-CM | POA: Diagnosis not present

## 2023-12-29 DIAGNOSIS — I10 Essential (primary) hypertension: Secondary | ICD-10-CM | POA: Diagnosis not present

## 2023-12-29 DIAGNOSIS — Z794 Long term (current) use of insulin: Secondary | ICD-10-CM | POA: Diagnosis not present

## 2023-12-29 DIAGNOSIS — I110 Hypertensive heart disease with heart failure: Secondary | ICD-10-CM | POA: Diagnosis not present

## 2023-12-29 DIAGNOSIS — E785 Hyperlipidemia, unspecified: Secondary | ICD-10-CM | POA: Diagnosis not present

## 2023-12-29 DIAGNOSIS — Z87440 Personal history of urinary (tract) infections: Secondary | ICD-10-CM | POA: Diagnosis not present

## 2023-12-29 DIAGNOSIS — I252 Old myocardial infarction: Secondary | ICD-10-CM | POA: Diagnosis not present

## 2023-12-31 DIAGNOSIS — Z79899 Other long term (current) drug therapy: Secondary | ICD-10-CM | POA: Diagnosis not present

## 2024-01-06 DIAGNOSIS — Z79899 Other long term (current) drug therapy: Secondary | ICD-10-CM | POA: Diagnosis not present

## 2024-01-25 DIAGNOSIS — I10 Essential (primary) hypertension: Secondary | ICD-10-CM | POA: Diagnosis not present

## 2024-01-25 DIAGNOSIS — K760 Fatty (change of) liver, not elsewhere classified: Secondary | ICD-10-CM | POA: Diagnosis not present

## 2024-01-25 DIAGNOSIS — E1165 Type 2 diabetes mellitus with hyperglycemia: Secondary | ICD-10-CM | POA: Diagnosis not present

## 2024-01-25 DIAGNOSIS — E669 Obesity, unspecified: Secondary | ICD-10-CM | POA: Diagnosis not present

## 2024-01-25 DIAGNOSIS — D649 Anemia, unspecified: Secondary | ICD-10-CM | POA: Diagnosis not present

## 2024-02-08 DIAGNOSIS — Z6833 Body mass index (BMI) 33.0-33.9, adult: Secondary | ICD-10-CM | POA: Diagnosis not present

## 2024-02-08 DIAGNOSIS — S81001A Unspecified open wound, right knee, initial encounter: Secondary | ICD-10-CM | POA: Diagnosis not present

## 2024-03-27 DIAGNOSIS — I10 Essential (primary) hypertension: Secondary | ICD-10-CM | POA: Diagnosis not present

## 2024-03-27 DIAGNOSIS — L03115 Cellulitis of right lower limb: Secondary | ICD-10-CM | POA: Diagnosis not present

## 2024-03-27 DIAGNOSIS — M25512 Pain in left shoulder: Secondary | ICD-10-CM | POA: Diagnosis not present

## 2024-03-27 DIAGNOSIS — Z6834 Body mass index (BMI) 34.0-34.9, adult: Secondary | ICD-10-CM | POA: Diagnosis not present

## 2024-03-27 DIAGNOSIS — I25119 Atherosclerotic heart disease of native coronary artery with unspecified angina pectoris: Secondary | ICD-10-CM | POA: Diagnosis not present

## 2024-03-27 DIAGNOSIS — E1159 Type 2 diabetes mellitus with other circulatory complications: Secondary | ICD-10-CM | POA: Diagnosis not present

## 2024-05-04 DIAGNOSIS — M25512 Pain in left shoulder: Secondary | ICD-10-CM | POA: Diagnosis not present

## 2024-05-04 DIAGNOSIS — G8929 Other chronic pain: Secondary | ICD-10-CM | POA: Diagnosis not present

## 2024-05-04 DIAGNOSIS — M25511 Pain in right shoulder: Secondary | ICD-10-CM | POA: Diagnosis not present

## 2024-07-26 ENCOUNTER — Encounter: Payer: Self-pay | Admitting: Pharmacist

## 2024-07-26 NOTE — Progress Notes (Signed)
 Pharmacy Quality Measure Review  This patient is appearing on a report for being at risk of failing the adherence measure for cholesterol (statin) and hypertension (ACEi/ARB) medications this calendar year.   Medication: atrovastatin 40 mg  Last fill date: 07/19/2024 for 90 day supply  Medication: benazeprill 40 mg Last fill date: 07/06/2024 for 90 day supply  Insurance report was not up to date. No action needed at this time.   Annabella Galla, PharmD Clinical Pharmacist [place] Direct Dial: [phone]
